# Patient Record
Sex: Female | Born: 1975 | Race: White | Hispanic: No | Smoking: Never smoker
Health system: Southern US, Community
[De-identification: ages and names within clinical notes are randomized; demographics above are authoritative.]

## PROBLEM LIST (undated history)

## (undated) DIAGNOSIS — F32A Depression, unspecified: Secondary | ICD-10-CM

## (undated) DIAGNOSIS — J189 Pneumonia, unspecified organism: Secondary | ICD-10-CM

## (undated) DIAGNOSIS — F909 Attention-deficit hyperactivity disorder, unspecified type: Secondary | ICD-10-CM

## (undated) DIAGNOSIS — F1110 Opioid abuse, uncomplicated: Secondary | ICD-10-CM

## (undated) DIAGNOSIS — F419 Anxiety disorder, unspecified: Secondary | ICD-10-CM

## (undated) DIAGNOSIS — K921 Melena: Secondary | ICD-10-CM

## (undated) DIAGNOSIS — K8010 Calculus of gallbladder with chronic cholecystitis without obstruction: Secondary | ICD-10-CM

## (undated) DIAGNOSIS — I10 Essential (primary) hypertension: Secondary | ICD-10-CM

## (undated) DIAGNOSIS — F329 Major depressive disorder, single episode, unspecified: Secondary | ICD-10-CM

## (undated) DIAGNOSIS — R51 Headache: Secondary | ICD-10-CM

## (undated) DIAGNOSIS — Z22322 Carrier or suspected carrier of Methicillin resistant Staphylococcus aureus: Secondary | ICD-10-CM

## (undated) HISTORY — DX: Carrier or suspected carrier of methicillin resistant Staphylococcus aureus: Z22.322

## (undated) HISTORY — DX: Major depressive disorder, single episode, unspecified: F32.9

## (undated) HISTORY — DX: Depression, unspecified: F32.A

## (undated) HISTORY — DX: Melena: K92.1

## (undated) HISTORY — DX: Headache: R51

## (undated) HISTORY — PX: TYMPANOPLASTY: SHX33

## (undated) HISTORY — DX: Essential (primary) hypertension: I10

## (undated) HISTORY — DX: Anxiety disorder, unspecified: F41.9

## (undated) HISTORY — PX: TYMPANOSTOMY TUBE PLACEMENT: SHX32

## (undated) HISTORY — DX: Attention-deficit hyperactivity disorder, unspecified type: F90.9

## (undated) HISTORY — DX: Opioid abuse, uncomplicated: F11.10

## (undated) HISTORY — PX: COLONOSCOPY: SHX174

## (undated) HISTORY — PX: TUBAL LIGATION: SHX77

---

## 1997-09-13 ENCOUNTER — Inpatient Hospital Stay (HOSPITAL_COMMUNITY): Admission: AD | Admit: 1997-09-13 | Discharge: 1997-09-13 | Payer: Self-pay | Admitting: Obstetrics and Gynecology

## 1999-10-23 ENCOUNTER — Other Ambulatory Visit: Admission: RE | Admit: 1999-10-23 | Discharge: 1999-10-23 | Payer: Self-pay | Admitting: Obstetrics and Gynecology

## 1999-12-20 ENCOUNTER — Encounter: Payer: Self-pay | Admitting: Obstetrics and Gynecology

## 1999-12-20 ENCOUNTER — Ambulatory Visit (HOSPITAL_COMMUNITY): Admission: RE | Admit: 1999-12-20 | Discharge: 1999-12-20 | Payer: Self-pay | Admitting: Obstetrics and Gynecology

## 2000-01-17 ENCOUNTER — Ambulatory Visit (HOSPITAL_COMMUNITY): Admission: RE | Admit: 2000-01-17 | Discharge: 2000-01-17 | Payer: Self-pay | Admitting: Obstetrics and Gynecology

## 2000-01-17 ENCOUNTER — Encounter: Payer: Self-pay | Admitting: Obstetrics and Gynecology

## 2000-02-21 ENCOUNTER — Ambulatory Visit (HOSPITAL_COMMUNITY): Admission: RE | Admit: 2000-02-21 | Discharge: 2000-02-21 | Payer: Self-pay | Admitting: Obstetrics and Gynecology

## 2000-04-05 ENCOUNTER — Inpatient Hospital Stay (HOSPITAL_COMMUNITY): Admission: AD | Admit: 2000-04-05 | Discharge: 2000-04-05 | Payer: Self-pay | Admitting: Obstetrics and Gynecology

## 2000-04-21 ENCOUNTER — Ambulatory Visit (HOSPITAL_COMMUNITY): Admission: RE | Admit: 2000-04-21 | Discharge: 2000-04-21 | Payer: Self-pay | Admitting: Obstetrics and Gynecology

## 2000-04-21 ENCOUNTER — Encounter: Payer: Self-pay | Admitting: Obstetrics and Gynecology

## 2000-05-04 ENCOUNTER — Inpatient Hospital Stay (HOSPITAL_COMMUNITY): Admission: AD | Admit: 2000-05-04 | Discharge: 2000-05-04 | Payer: Self-pay | Admitting: Obstetrics and Gynecology

## 2000-05-09 ENCOUNTER — Inpatient Hospital Stay (HOSPITAL_COMMUNITY): Admission: AD | Admit: 2000-05-09 | Discharge: 2000-05-11 | Payer: Self-pay | Admitting: Obstetrics and Gynecology

## 2000-11-05 ENCOUNTER — Other Ambulatory Visit: Admission: RE | Admit: 2000-11-05 | Discharge: 2000-11-05 | Payer: Self-pay | Admitting: Obstetrics and Gynecology

## 2001-11-12 ENCOUNTER — Other Ambulatory Visit: Admission: RE | Admit: 2001-11-12 | Discharge: 2001-11-12 | Payer: Self-pay | Admitting: Obstetrics and Gynecology

## 2002-04-02 ENCOUNTER — Encounter: Admission: RE | Admit: 2002-04-02 | Discharge: 2002-05-06 | Payer: Self-pay | Admitting: Neurology

## 2002-08-30 ENCOUNTER — Ambulatory Visit (HOSPITAL_BASED_OUTPATIENT_CLINIC_OR_DEPARTMENT_OTHER): Admission: RE | Admit: 2002-08-30 | Discharge: 2002-08-30 | Payer: Self-pay | Admitting: Neurology

## 2002-12-23 ENCOUNTER — Ambulatory Visit (HOSPITAL_COMMUNITY): Admission: RE | Admit: 2002-12-23 | Discharge: 2002-12-23 | Payer: Self-pay | Admitting: Obstetrics and Gynecology

## 2002-12-23 ENCOUNTER — Other Ambulatory Visit: Admission: RE | Admit: 2002-12-23 | Discharge: 2002-12-23 | Payer: Self-pay | Admitting: Obstetrics and Gynecology

## 2002-12-23 ENCOUNTER — Encounter: Payer: Self-pay | Admitting: Obstetrics and Gynecology

## 2002-12-31 ENCOUNTER — Emergency Department (HOSPITAL_COMMUNITY): Admission: EM | Admit: 2002-12-31 | Discharge: 2002-12-31 | Payer: Self-pay | Admitting: Emergency Medicine

## 2002-12-31 ENCOUNTER — Encounter: Payer: Self-pay | Admitting: Emergency Medicine

## 2003-03-13 ENCOUNTER — Emergency Department (HOSPITAL_COMMUNITY): Admission: EM | Admit: 2003-03-13 | Discharge: 2003-03-13 | Payer: Self-pay | Admitting: Emergency Medicine

## 2003-06-28 ENCOUNTER — Ambulatory Visit (HOSPITAL_COMMUNITY): Admission: RE | Admit: 2003-06-28 | Discharge: 2003-06-28 | Payer: Self-pay | Admitting: Obstetrics and Gynecology

## 2003-07-22 ENCOUNTER — Ambulatory Visit (HOSPITAL_BASED_OUTPATIENT_CLINIC_OR_DEPARTMENT_OTHER): Admission: RE | Admit: 2003-07-22 | Discharge: 2003-07-22 | Payer: Self-pay | Admitting: Otolaryngology

## 2003-07-23 ENCOUNTER — Emergency Department (HOSPITAL_COMMUNITY): Admission: AD | Admit: 2003-07-23 | Discharge: 2003-07-23 | Payer: Self-pay | Admitting: Family Medicine

## 2003-10-19 ENCOUNTER — Emergency Department (HOSPITAL_COMMUNITY): Admission: AD | Admit: 2003-10-19 | Discharge: 2003-10-19 | Payer: Self-pay | Admitting: Family Medicine

## 2004-02-21 ENCOUNTER — Other Ambulatory Visit: Admission: RE | Admit: 2004-02-21 | Discharge: 2004-02-21 | Payer: Self-pay | Admitting: Obstetrics and Gynecology

## 2004-06-28 ENCOUNTER — Ambulatory Visit: Payer: Self-pay | Admitting: Family Medicine

## 2004-07-25 ENCOUNTER — Ambulatory Visit: Payer: Self-pay | Admitting: Family Medicine

## 2004-09-08 ENCOUNTER — Ambulatory Visit: Payer: Self-pay | Admitting: Internal Medicine

## 2004-10-14 ENCOUNTER — Emergency Department (HOSPITAL_COMMUNITY): Admission: EM | Admit: 2004-10-14 | Discharge: 2004-10-14 | Payer: Self-pay | Admitting: Family Medicine

## 2004-12-26 ENCOUNTER — Emergency Department (HOSPITAL_COMMUNITY): Admission: EM | Admit: 2004-12-26 | Discharge: 2004-12-26 | Payer: Self-pay | Admitting: Emergency Medicine

## 2004-12-28 ENCOUNTER — Ambulatory Visit: Payer: Self-pay | Admitting: Family Medicine

## 2005-01-18 ENCOUNTER — Ambulatory Visit: Payer: Self-pay | Admitting: Family Medicine

## 2005-03-13 ENCOUNTER — Ambulatory Visit: Payer: Self-pay | Admitting: Family Medicine

## 2005-03-25 ENCOUNTER — Other Ambulatory Visit: Admission: RE | Admit: 2005-03-25 | Discharge: 2005-03-25 | Payer: Self-pay | Admitting: Obstetrics and Gynecology

## 2005-04-01 ENCOUNTER — Ambulatory Visit: Payer: Self-pay | Admitting: Gastroenterology

## 2005-04-10 ENCOUNTER — Ambulatory Visit: Payer: Self-pay | Admitting: Gastroenterology

## 2005-04-10 ENCOUNTER — Ambulatory Visit (HOSPITAL_COMMUNITY): Admission: RE | Admit: 2005-04-10 | Discharge: 2005-04-10 | Payer: Self-pay | Admitting: Gastroenterology

## 2005-06-27 ENCOUNTER — Ambulatory Visit: Payer: Self-pay | Admitting: Family Medicine

## 2005-07-30 ENCOUNTER — Ambulatory Visit: Payer: Self-pay | Admitting: Family Medicine

## 2005-08-20 ENCOUNTER — Ambulatory Visit: Payer: Self-pay | Admitting: Family Medicine

## 2006-06-04 ENCOUNTER — Other Ambulatory Visit: Payer: Self-pay

## 2006-06-04 ENCOUNTER — Emergency Department: Payer: Self-pay | Admitting: Emergency Medicine

## 2006-06-06 ENCOUNTER — Ambulatory Visit: Payer: Self-pay | Admitting: Family Medicine

## 2007-03-18 ENCOUNTER — Telehealth: Payer: Self-pay | Admitting: Family Medicine

## 2007-03-27 DIAGNOSIS — R51 Headache: Secondary | ICD-10-CM

## 2007-03-27 DIAGNOSIS — R519 Headache, unspecified: Secondary | ICD-10-CM | POA: Insufficient documentation

## 2007-04-17 ENCOUNTER — Telehealth: Payer: Self-pay | Admitting: Family Medicine

## 2007-05-12 ENCOUNTER — Ambulatory Visit: Payer: Self-pay | Admitting: Family Medicine

## 2007-05-12 DIAGNOSIS — B9789 Other viral agents as the cause of diseases classified elsewhere: Secondary | ICD-10-CM | POA: Insufficient documentation

## 2007-05-13 ENCOUNTER — Telehealth: Payer: Self-pay | Admitting: Family Medicine

## 2007-05-14 ENCOUNTER — Telehealth: Payer: Self-pay | Admitting: Family Medicine

## 2007-07-15 ENCOUNTER — Telehealth: Payer: Self-pay | Admitting: Family Medicine

## 2007-07-21 ENCOUNTER — Ambulatory Visit (HOSPITAL_COMMUNITY): Admission: RE | Admit: 2007-07-21 | Discharge: 2007-07-21 | Payer: Self-pay | Admitting: Obstetrics and Gynecology

## 2007-08-17 ENCOUNTER — Telehealth: Payer: Self-pay | Admitting: Family Medicine

## 2007-09-21 ENCOUNTER — Telehealth: Payer: Self-pay | Admitting: Family Medicine

## 2007-09-23 ENCOUNTER — Ambulatory Visit: Payer: Self-pay | Admitting: Family Medicine

## 2007-09-24 ENCOUNTER — Encounter: Payer: Self-pay | Admitting: Family Medicine

## 2007-11-02 ENCOUNTER — Telehealth: Payer: Self-pay | Admitting: Family Medicine

## 2007-12-18 ENCOUNTER — Telehealth: Payer: Self-pay | Admitting: Family Medicine

## 2008-01-20 ENCOUNTER — Telehealth: Payer: Self-pay | Admitting: Family Medicine

## 2008-03-16 ENCOUNTER — Telehealth: Payer: Self-pay | Admitting: Family Medicine

## 2008-03-18 ENCOUNTER — Telehealth: Payer: Self-pay | Admitting: Family Medicine

## 2008-03-22 ENCOUNTER — Ambulatory Visit: Payer: Self-pay | Admitting: Family Medicine

## 2008-03-22 DIAGNOSIS — F909 Attention-deficit hyperactivity disorder, unspecified type: Secondary | ICD-10-CM | POA: Insufficient documentation

## 2008-03-22 DIAGNOSIS — I10 Essential (primary) hypertension: Secondary | ICD-10-CM | POA: Insufficient documentation

## 2008-04-14 ENCOUNTER — Emergency Department (HOSPITAL_COMMUNITY): Admission: EM | Admit: 2008-04-14 | Discharge: 2008-04-14 | Payer: Self-pay | Admitting: Family Medicine

## 2008-04-15 ENCOUNTER — Telehealth: Payer: Self-pay | Admitting: Family Medicine

## 2008-05-17 ENCOUNTER — Telehealth: Payer: Self-pay | Admitting: Family Medicine

## 2008-06-17 ENCOUNTER — Telehealth: Payer: Self-pay | Admitting: Family Medicine

## 2008-07-20 ENCOUNTER — Telehealth: Payer: Self-pay | Admitting: Family Medicine

## 2008-07-30 ENCOUNTER — Emergency Department (HOSPITAL_COMMUNITY): Admission: EM | Admit: 2008-07-30 | Discharge: 2008-07-30 | Payer: Self-pay | Admitting: Family Medicine

## 2008-08-11 ENCOUNTER — Telehealth: Payer: Self-pay | Admitting: Family Medicine

## 2008-08-18 ENCOUNTER — Telehealth: Payer: Self-pay | Admitting: Family Medicine

## 2008-08-31 ENCOUNTER — Telehealth: Payer: Self-pay | Admitting: Family Medicine

## 2008-08-31 ENCOUNTER — Ambulatory Visit: Payer: Self-pay | Admitting: Family Medicine

## 2008-08-31 DIAGNOSIS — F1910 Other psychoactive substance abuse, uncomplicated: Secondary | ICD-10-CM

## 2008-08-31 DIAGNOSIS — N3000 Acute cystitis without hematuria: Secondary | ICD-10-CM

## 2008-08-31 DIAGNOSIS — F329 Major depressive disorder, single episode, unspecified: Secondary | ICD-10-CM

## 2008-08-31 LAB — CONVERTED CEMR LAB
Bilirubin Urine: NEGATIVE
Glucose, Urine, Semiquant: NEGATIVE
Ketones, urine, test strip: NEGATIVE
Urobilinogen, UA: 0.2
pH: 6.5

## 2008-09-08 ENCOUNTER — Telehealth: Payer: Self-pay | Admitting: Family Medicine

## 2008-09-14 ENCOUNTER — Ambulatory Visit: Payer: Self-pay | Admitting: Family Medicine

## 2008-09-28 ENCOUNTER — Ambulatory Visit: Payer: Self-pay | Admitting: Family Medicine

## 2008-09-28 DIAGNOSIS — F411 Generalized anxiety disorder: Secondary | ICD-10-CM | POA: Insufficient documentation

## 2008-10-17 ENCOUNTER — Ambulatory Visit: Payer: Self-pay | Admitting: Family Medicine

## 2009-02-20 ENCOUNTER — Ambulatory Visit (HOSPITAL_COMMUNITY): Admission: RE | Admit: 2009-02-20 | Discharge: 2009-02-20 | Payer: Self-pay | Admitting: Obstetrics and Gynecology

## 2009-04-25 ENCOUNTER — Ambulatory Visit (HOSPITAL_COMMUNITY): Admission: RE | Admit: 2009-04-25 | Discharge: 2009-04-25 | Payer: Self-pay | Admitting: Obstetrics and Gynecology

## 2009-04-29 ENCOUNTER — Inpatient Hospital Stay (HOSPITAL_COMMUNITY): Admission: AD | Admit: 2009-04-29 | Discharge: 2009-04-29 | Payer: Self-pay | Admitting: Obstetrics and Gynecology

## 2009-06-08 ENCOUNTER — Encounter (INDEPENDENT_AMBULATORY_CARE_PROVIDER_SITE_OTHER): Payer: Self-pay | Admitting: Obstetrics and Gynecology

## 2009-06-08 ENCOUNTER — Inpatient Hospital Stay (HOSPITAL_COMMUNITY): Admission: RE | Admit: 2009-06-08 | Discharge: 2009-06-11 | Payer: Self-pay | Admitting: Obstetrics and Gynecology

## 2009-06-13 ENCOUNTER — Observation Stay (HOSPITAL_COMMUNITY): Admission: AD | Admit: 2009-06-13 | Discharge: 2009-06-14 | Payer: Self-pay | Admitting: Obstetrics and Gynecology

## 2009-08-07 ENCOUNTER — Telehealth: Payer: Self-pay | Admitting: Family Medicine

## 2009-08-24 ENCOUNTER — Telehealth: Payer: Self-pay | Admitting: Family Medicine

## 2009-08-25 ENCOUNTER — Ambulatory Visit: Payer: Self-pay | Admitting: Family Medicine

## 2009-08-25 DIAGNOSIS — H6120 Impacted cerumen, unspecified ear: Secondary | ICD-10-CM

## 2009-08-25 DIAGNOSIS — H9209 Otalgia, unspecified ear: Secondary | ICD-10-CM | POA: Insufficient documentation

## 2009-08-25 DIAGNOSIS — K120 Recurrent oral aphthae: Secondary | ICD-10-CM

## 2009-10-11 ENCOUNTER — Ambulatory Visit (HOSPITAL_BASED_OUTPATIENT_CLINIC_OR_DEPARTMENT_OTHER): Admission: RE | Admit: 2009-10-11 | Discharge: 2009-10-11 | Payer: Self-pay | Admitting: General Surgery

## 2009-12-04 ENCOUNTER — Telehealth: Payer: Self-pay | Admitting: Family Medicine

## 2010-01-01 ENCOUNTER — Telehealth: Payer: Self-pay | Admitting: Family Medicine

## 2010-04-03 ENCOUNTER — Telehealth: Payer: Self-pay | Admitting: Family Medicine

## 2010-08-08 ENCOUNTER — Telehealth: Payer: Self-pay | Admitting: Family Medicine

## 2010-08-12 HISTORY — PX: KNEE ARTHROSCOPY: SUR90

## 2010-08-17 ENCOUNTER — Ambulatory Visit: Admit: 2010-08-17 | Payer: Self-pay | Admitting: Family Medicine

## 2010-09-01 ENCOUNTER — Encounter: Payer: Self-pay | Admitting: Obstetrics and Gynecology

## 2010-09-02 ENCOUNTER — Encounter: Payer: Self-pay | Admitting: Obstetrics and Gynecology

## 2010-09-03 ENCOUNTER — Encounter: Payer: Self-pay | Admitting: Family Medicine

## 2010-09-10 ENCOUNTER — Emergency Department (HOSPITAL_COMMUNITY)
Admission: EM | Admit: 2010-09-10 | Discharge: 2010-09-10 | Payer: Self-pay | Source: Home / Self Care | Admitting: Emergency Medicine

## 2010-09-10 LAB — POCT URINALYSIS DIPSTICK
Bilirubin Urine: NEGATIVE
Hgb urine dipstick: NEGATIVE
Ketones, ur: NEGATIVE mg/dL
Nitrite: NEGATIVE
Protein, ur: NEGATIVE mg/dL
Specific Gravity, Urine: 1.02 (ref 1.005–1.030)
Urine Glucose, Fasting: NEGATIVE mg/dL
Urobilinogen, UA: 1 mg/dL (ref 0.0–1.0)
pH: 8.5 — ABNORMAL HIGH (ref 5.0–8.0)

## 2010-09-11 NOTE — Progress Notes (Signed)
Summary: REFILL REQUEST  Phone Note Refill Request Message from:  Patient on April 03, 2010 1:09 PM  Refills Requested: Medication #1:  ADDERALL 30 MG TABS two times a day   Notes: Pt can be reached at [phone] when Rx is ready for p/u.    Initial call taken by: Debbra Riding,  April 03, 2010 1:09 PM  Follow-up for Phone Call        done Follow-up by: Nelwyn Salisbury MD,  April 04, 2010 1:32 PM    New/Updated Medications: ADDERALL 30 MG TABS (AMPHETAMINE-DEXTROAMPHETAMINE) two times a day ADDERALL 30 MG TABS (AMPHETAMINE-DEXTROAMPHETAMINE) two times a day, may fill on 05-05-10 ADDERALL 30 MG TABS (AMPHETAMINE-DEXTROAMPHETAMINE) two times a day, may fill on 06-04-10 Prescriptions: ADDERALL 30 MG TABS (AMPHETAMINE-DEXTROAMPHETAMINE) two times a day, may fill on 06-04-10  #60 x 0   Entered and Authorized by:   Nelwyn Salisbury MD   Signed by:   Nelwyn Salisbury MD on 04/04/2010   Method used:   Print then Give to Patient   RxID:   [id number] ADDERALL 30 MG TABS (AMPHETAMINE-DEXTROAMPHETAMINE) two times a day, may fill on 05-05-10  #60 x 0   Entered and Authorized by:   Nelwyn Salisbury MD   Signed by:   Nelwyn Salisbury MD on 04/04/2010   Method used:   Print then Give to Patient   RxID:   [id number] ADDERALL 30 MG TABS (AMPHETAMINE-DEXTROAMPHETAMINE) two times a day  #60 x 0   Entered and Authorized by:   Nelwyn Salisbury MD   Signed by:   Nelwyn Salisbury MD on 04/04/2010   Method used:   Print then Give to Patient   RxID:   [id number]

## 2010-09-11 NOTE — Assessment & Plan Note (Signed)
Summary: MOUTH ULCERS / MED CK (REFILL) //RS   Vital Signs:  Patient profile:   35 year old female Weight:      251.5 pounds Temp:     97.8 degrees F oral Pulse rate:   131 / minute BP sitting:   114 / 84  (left arm) Cuff size:   large  Vitals Entered By: Alfred Levins, CMA (August 25, 2009 2:33 PM) CC: renew meds, blisters in mouth   History of Present Illness: Here for several reasons. This is the first time I have seen her since the delivery of her second child 3 months ago. Her pregnancy went well,and she had a Csection followed by a BTL per Dr. Senaida Ores. During her pregancy her migraines went away and her BP normalized. In fact she was off all BP meds during the pregnancy. After delivery her BP went back up, and Dr. Senaida Ores put her back on Hyzaar like she was before. She has been back on this for 3 weeks, and her BP is normal.  She has returned to work full time, and thus she needed to get back on Adderall. Also 3 days ago she developed several painful ulcerations in her mouth, and she would like me to look at them. No URI symptoms. She also developed an uncomfortable pressure sensation in the right ear about 3 days ago.   Current Medications (verified): 1)  Adderall 30 Mg Tabs (Amphetamine-Dextroamphetamine) .... Two Times A Day  Allergies (verified): No Known Drug Allergies  Past History:  Past Medical History: Blood in Stool Headache/Migraines Hypertension ADHD narcotic abuse Anxiety Depression sees Dr. Senaida Ores for GYN exams MRSA infection at C  section site 05-2009, resolved   Past Surgical History: Tubal ligation 2008  Caesarean section 05-2009 and repeat tubal ligation T tube in right ear 2009 per Dr. Annalee Genta  Review of Systems  The patient denies anorexia, fever, weight loss, weight gain, vision loss, decreased hearing, hoarseness, chest pain, syncope, dyspnea on exertion, peripheral edema, prolonged cough, headaches, hemoptysis, abdominal pain,  melena, hematochezia, severe indigestion/heartburn, hematuria, incontinence, genital sores, muscle weakness, suspicious skin lesions, transient blindness, difficulty walking, depression, unusual weight change, abnormal bleeding, enlarged lymph nodes, angioedema, breast masses, and testicular masses.    Physical Exam  General:  Well-developed,well-nourished,in no acute distress; alert,appropriate and cooperative throughout examination Head:  Normocephalic and atraumatic without obvious abnormalities. No apparent alopecia or balding. Eyes:  No corneal or conjunctival inflammation noted. EOMI. Perrla. Funduscopic exam benign, without hemorrhages, exudates or papilledema. Vision grossly normal. Ears:  left ear is clear, the right ear has some cerumen in the canal, and a T tube is lying in the canal behind the cerumen. The TM is inflamed but not infected Nose:  External nasal examination shows no deformity or inflammation. Nasal mucosa are pink and moist without lesions or exudates. Mouth:  5 or 6 aphthous ulcers on the right cheek and tongue Neck:  No deformities, masses, or tenderness noted. Lungs:  Normal respiratory effort, chest expands symmetrically. Lungs are clear to auscultation, no crackles or wheezes. Heart:  Normal rate and regular rhythm. S1 and S2 normal without gallop, murmur, click, rub or other extra sounds. Neurologic:  alert & oriented X3, cranial nerves II-XII intact, strength normal in all extremities, and gait normal.   Psych:  Cognition and judgment appear intact. Alert and cooperative with normal attention span and concentration. No apparent delusions, illusions, hallucinations   Impression & Recommendations:  Problem # 1:  APHTHOUS ULCERS (ICD-528.2)  Problem #  2:  CERUMEN IMPACTION (ICD-380.4)  Problem # 3:  EAR PAIN (ICD-388.70)  Problem # 4:  ADHD (ICD-314.01)  Problem # 5:  HYPERTENSION (ICD-401.9)  Her updated medication list for this problem includes:    Hyzaar  50-12.5 Mg Tabs (Losartan potassium-hctz) ..... Once daily  Problem # 6:  HEADACHE (ICD-784.0)  The following medications were removed from the medication list:    Vicodin 5-500 Mg Tabs (Hydrocodone-acetaminophen) .Marland Kitchen..Marland Kitchen Two times a day  Complete Medication List: 1)  Adderall 30 Mg Tabs (Amphetamine-dextroamphetamine) .... Two times a day, may fill on 10-23-09 2)  Hyzaar 50-12.5 Mg Tabs (Losartan potassium-hctz) .... Once daily  Patient Instructions: 1)  We refilled Adderall for 3 months. Her HTN is stable, so we refilled her Hyzaar. Her right ear pain is probably from the T tube coming out a few days ago. We could not irrigate the cerumen out today due to her not tolerating this well. We decided to leave it alone for a few days to allow the TM inflammation to subside. After that she will try an OTC ear was removal product. if that doesn't work , I suggested she see Dr. Annalee Genta again.  Prescriptions: HYZAAR 50-12.5 MG TABS (LOSARTAN POTASSIUM-HCTZ) once daily  #30 x 11   Entered and Authorized by:   Nelwyn Salisbury MD   Signed by:   Nelwyn Salisbury MD on 08/25/2009   Method used:   [facility] [phone][facility] (retail)       [address]       [place], [place]  [address]       Ph: [phone] or [phone]       Fax: [phone]   RxID:   [id number] ADDERALL 30 MG TABS (AMPHETAMINE-DEXTROAMPHETAMINE) two times a day, may fill on 10-23-09  #60 x 0   Entered and Authorized by:   Nelwyn Salisbury MD   Signed by:   Nelwyn Salisbury MD on 08/25/2009   Method used:   Print then Give to Patient   RxID:   [id number] ADDERALL 30 MG TABS (AMPHETAMINE-DEXTROAMPHETAMINE) two times a day, may fill on 09-25-09  #60 x 0   Entered and Authorized by:   Nelwyn Salisbury MD   Signed by:   Nelwyn Salisbury MD on 08/25/2009   Method used:   Print then Give to Patient   RxID:   [id number] ADDERALL 30 MG TABS (AMPHETAMINE-DEXTROAMPHETAMINE) two times a day  #60 x 0   Entered and  Authorized by:   Nelwyn Salisbury MD   Signed by:   Nelwyn Salisbury MD on 08/25/2009   Method used:   Print then Give to Patient   RxID:   [id number]

## 2010-09-11 NOTE — Progress Notes (Signed)
Summary: NEW RX  Phone Note Call from Patient Call back at Home Phone [phone]   Caller: Patient Call For: Nelwyn Salisbury MD Summary of Call: PT NEEDS NEW RX ADDERALL 30 MG Initial call taken by: Heron Sabins,  Jan 01, 2010 9:54 AM  Follow-up for Phone Call        done Follow-up by: Nelwyn Salisbury MD,  Jan 01, 2010 10:51 AM  Additional Follow-up for Phone Call Additional follow up Details #1::        Phone Call Completed Additional Follow-up by: Raechel Ache, RN,  Jan 01, 2010 10:57 AM    New/Updated Medications: ADDERALL 30 MG TABS (AMPHETAMINE-DEXTROAMPHETAMINE) two times a day ADDERALL 30 MG TABS (AMPHETAMINE-DEXTROAMPHETAMINE) two times a day, may fill on 02-01-10 ADDERALL 30 MG TABS (AMPHETAMINE-DEXTROAMPHETAMINE) two times a day, may fill on 03-03-10 Prescriptions: ADDERALL 30 MG TABS (AMPHETAMINE-DEXTROAMPHETAMINE) two times a day, may fill on 03-03-10  #60 x 0   Entered and Authorized by:   Nelwyn Salisbury MD   Signed by:   Nelwyn Salisbury MD on 01/01/2010   Method used:   Print then Give to Patient   RxID:   [id number] ADDERALL 30 MG TABS (AMPHETAMINE-DEXTROAMPHETAMINE) two times a day, may fill on 02-01-10  #60 x 0   Entered and Authorized by:   Nelwyn Salisbury MD   Signed by:   Nelwyn Salisbury MD on 01/01/2010   Method used:   Print then Give to Patient   RxID:   [id number] ADDERALL 30 MG TABS (AMPHETAMINE-DEXTROAMPHETAMINE) two times a day  #60 x 0   Entered and Authorized by:   Nelwyn Salisbury MD   Signed by:   Nelwyn Salisbury MD on 01/01/2010   Method used:   Print then Give to Patient   RxID:   [id number]

## 2010-09-11 NOTE — Progress Notes (Signed)
Summary: sda slot  Phone Note Call from Patient Call back at Home Phone [phone]   Caller: Patient Call For: Nelwyn Salisbury MD Reason for Call: Acute Illness Summary of Call: pt has blister in mouth also needs fup on meds.Can I sch for friday in sda slot afternoon? Initial call taken by: Heron Sabins,  August 24, 2009 10:16 AM  Follow-up for Phone Call        okay Follow-up by: Nelwyn Salisbury MD,  August 24, 2009 11:43 AM  Additional Follow-up for Phone Call Additional follow up Details #1::        lmom Additional Follow-up by: Heron Sabins,  August 24, 2009 11:58 AM    Additional Follow-up for Phone Call Additional follow up Details #2::    ov sch Follow-up by: Heron Sabins,  August 25, 2009 8:31 AM

## 2010-09-11 NOTE — Progress Notes (Signed)
Summary: REQ FOR REFILL RX   (HYZAAR)  Phone Note Refill Request   Refills Requested: Medication #1:  HYZAAR 50-12.5 MG TABS once daily.   Notes: Pt [facility] Initial call taken by: Debbra Riding,  December 04, 2009 11:03 AM Caller: Patient     [phone]    Prescriptions: HYZAAR 50-12.5 MG TABS (LOSARTAN POTASSIUM-HCTZ) once daily  #30 x 11   Entered by:   Raechel Ache, RN   Authorized by:   Nelwyn Salisbury MD   Signed by:   Raechel Ache, RN on 12/04/2009   Method used:   [facility]ronically to        [facility] [phone][facility] (retail)       [address]       [place], [place]  [address]       Ph: [phone] or [phone]       Fax: [phone]   RxID:   [id number]

## 2010-09-11 NOTE — Progress Notes (Signed)
Summary: refill  Phone Note Call from Patient Call back at [phone]   Caller: pt live Call For: Clent Ridges  Summary of Call: adderall 30mg  She got the XR the last time and it is too expensive.  She needs to go back to the regular Adderall 30mg . take 2 per day  Initial call taken by: Roselle Locus,  June 17, 2008 11:33 AM    New/Updated Medications: ADDERALL 30 MG TABS (AMPHETAMINE-DEXTROAMPHETAMINE) two times a day   Prescriptions: ADDERALL 30 MG TABS (AMPHETAMINE-DEXTROAMPHETAMINE) two times a day  #60 x 0   Entered by:   Nelwyn Salisbury MD   Authorized by:   Alfred Levins, CMA   Signed by:   Nelwyn Salisbury MD on 06/17/2008   Method used:   Print then Give to Patient   RxID:   [id number]

## 2010-09-13 NOTE — Progress Notes (Signed)
Summary: refill  Phone Note Refill Request Call back at Home Phone [phone] Message from:  Patient---live call  Refills Requested: Medication #1:  ADDERALL 30 MG TABS two times a day call pt when ready  Initial call taken by: Warnell Forester,  August 08, 2010 4:37 PM  Follow-up for Phone Call        I gave her a one month refill only. She needs to see me in January Follow-up by: Nelwyn Salisbury MD,  August 08, 2010 6:01 PM  Additional Follow-up for Phone Call Additional follow up Details #1::        pt aware Additional Follow-up by: Pura Spice, RN,  August 09, 2010 9:27 AM    New/Updated Medications: ADDERALL 30 MG TABS (AMPHETAMINE-DEXTROAMPHETAMINE) two times a day Prescriptions: ADDERALL 30 MG TABS (AMPHETAMINE-DEXTROAMPHETAMINE) two times a day  #60 x 0   Entered and Authorized by:   Nelwyn Salisbury MD   Signed by:   Nelwyn Salisbury MD on 08/08/2010   Method used:   Print then Give to Patient   RxID:   [id number]

## 2010-09-19 NOTE — Medication Information (Signed)
Summary: PA and Aprpoval for Losartan HCTZ  PA and Aprpoval for Losartan HCTZ   Imported By: Maryln Gottron 09/12/2010 12:11:51  _____________________________________________________________________  External Attachment:    Type:   Image     Comment:   External Document

## 2010-11-02 LAB — POCT I-STAT, CHEM 8
BUN: 6 mg/dL (ref 6–23)
Chloride: 101 mEq/L (ref 96–112)
Creatinine, Ser: 0.7 mg/dL (ref 0.4–1.2)
Potassium: 3.3 mEq/L — ABNORMAL LOW (ref 3.5–5.1)
Sodium: 138 mEq/L (ref 135–145)
TCO2: 27 mmol/L (ref 0–100)

## 2010-11-02 LAB — WOUND CULTURE

## 2010-11-02 LAB — POCT PREGNANCY, URINE: Preg Test, Ur: NEGATIVE

## 2010-11-14 LAB — COMPREHENSIVE METABOLIC PANEL
ALT: 25 U/L (ref 0–35)
AST: 26 U/L (ref 0–37)
Albumin: 2.5 g/dL — ABNORMAL LOW (ref 3.5–5.2)
CO2: 25 mEq/L (ref 19–32)
Calcium: 8.5 mg/dL (ref 8.4–10.5)
Chloride: 107 mEq/L (ref 96–112)
GFR calc Af Amer: >60 mL/min (ref >60)
GFR calc non Af Amer: >60 mL/min (ref >60)
Sodium: 139 mEq/L (ref 135–145)
Total Bilirubin: 0.7 mg/dL (ref 0.3–1.2)

## 2010-11-14 LAB — URIC ACID: Uric Acid, Serum: 4.9 mg/dL (ref 2.4–7.0)

## 2010-11-14 LAB — DIFFERENTIAL
Eosinophils Absolute: 0 10*3/uL (ref 0.0–0.7)
Eosinophils Relative: 1 % (ref 0–5)
Lymphs Abs: 0.6 10*3/uL — ABNORMAL LOW (ref 0.7–4.0)
Monocytes Absolute: 0.6 10*3/uL (ref 0.1–1.0)

## 2010-11-14 LAB — URINE CULTURE
Colony Count: >=100000
Special Requests: NEGATIVE

## 2010-11-14 LAB — CBC
Platelets: 220 10*3/uL (ref 150–400)
RBC: 2.4 MIL/uL — ABNORMAL LOW (ref 3.87–5.11)
WBC: 7.9 10*3/uL (ref 4.0–10.5)

## 2010-11-14 LAB — URINALYSIS, MICROSCOPIC ONLY
Bilirubin Urine: NEGATIVE
Ketones, ur: NEGATIVE mg/dL
Nitrite: NEGATIVE
pH: 6.5 (ref 5.0–8.0)

## 2010-11-15 LAB — RH IMMUNE GLOB WKUP(>/=20WKS)(NOT WOMEN'S HOSP): Fetal Screen: NEGATIVE

## 2010-11-15 LAB — CBC
HCT: 25.2 % — ABNORMAL LOW (ref 36.0–46.0)
HCT: 34.1 % — ABNORMAL LOW (ref 36.0–46.0)
Hemoglobin: 8.6 g/dL — ABNORMAL LOW (ref 12.0–15.0)
MCHC: 34.5 g/dL (ref 30.0–36.0)
MCV: 97.7 fL (ref 78.0–100.0)
Platelets: 160 10*3/uL (ref 150–400)
Platelets: 174 10*3/uL (ref 150–400)
RDW: 15.6 % — ABNORMAL HIGH (ref 11.5–15.5)
RDW: 16 % — ABNORMAL HIGH (ref 11.5–15.5)
WBC: 16.8 10*3/uL — ABNORMAL HIGH (ref 4.0–10.5)
WBC: 9.8 10*3/uL (ref 4.0–10.5)

## 2010-11-15 LAB — LACTATE DEHYDROGENASE: LDH: 162 U/L (ref 94–250)

## 2010-11-15 LAB — RPR: RPR Ser Ql: NONREACTIVE

## 2010-11-15 LAB — COMPREHENSIVE METABOLIC PANEL
Albumin: 3.3 g/dL — ABNORMAL LOW (ref 3.5–5.2)
Alkaline Phosphatase: 90 U/L (ref 39–117)
BUN: 4 mg/dL — ABNORMAL LOW (ref 6–23)
Creatinine, Ser: 0.51 mg/dL (ref 0.4–1.2)
Glucose, Bld: 102 mg/dL — ABNORMAL HIGH (ref 70–99)
Total Protein: 5.9 g/dL — ABNORMAL LOW (ref 6.0–8.3)

## 2010-11-15 LAB — CCBB MATERNAL DONOR DRAW

## 2010-11-15 LAB — URIC ACID: Uric Acid, Serum: 4.8 mg/dL (ref 2.4–7.0)

## 2010-11-16 LAB — WET PREP, GENITAL: Clue Cells Wet Prep HPF POC: NONE SEEN

## 2010-11-19 ENCOUNTER — Telehealth: Payer: Self-pay | Admitting: Family Medicine

## 2010-11-19 MED ORDER — AMPHETAMINE-DEXTROAMPHETAMINE 30 MG PO TABS: 30.0000 mg | ORAL_TABLET | Freq: Two times a day (BID) | ORAL | Status: DC

## 2010-11-19 NOTE — Telephone Encounter (Signed)
Pt called and needs script for Adderall 30 mg bid.

## 2010-11-19 NOTE — Telephone Encounter (Signed)
done

## 2010-11-20 NOTE — Telephone Encounter (Signed)
Pt notified rx ready for pick up.

## 2010-11-21 ENCOUNTER — Encounter: Payer: Self-pay | Admitting: Family Medicine

## 2010-11-27 ENCOUNTER — Ambulatory Visit: Payer: Self-pay | Admitting: Family Medicine

## 2010-12-28 NOTE — Discharge Summary (Signed)
[facility]  Patient:    Brittney Manning, Brittney Manning                     MRN: [id number] Adm. Date:  [id number] Disc. Date: 05/11/00 Attending:  Oliver Pila                           Discharge Summary  DISCHARGE DIAGNOSES:          1. Term pregnancy at 39 weeks delivered.                               2. History of macrosomia.                               3. Status post normal spontaneous vaginal delivery.  DISCHARGE MEDICATIONS:        1. Motrin 600 mg p.o. every 6 hours.                               2. Percocet one to two tablets p.o. every 4 hours.  DISCHARGE FOLLOW-UP:          The patient is to follow up in approximately 6 weeks for her routine postpartum exam.  HISTORY OF PRESENT ILLNESS:   The patient is a 35 year old G2, P1-0-0-1 who was admitted at 39 weeks for induction given a favorable cervix and a history of macrosomia in her last pregnancy. Current pregnancy showed an EFW of greater than 90th percentile on September 10. Pregnancy was otherwise uncomplicated, except for Rh negative status for which she received RhoGAM.  PRENATAL LABORATORY DATA:     A- antibody negative. RPR nonreactive. Rubella immune. Hepatitis B surface antigen negative. HIV negative. GC negative. Chlamydia negative. GPS negative.  PAST OBSTETRICAL HISTORY:     In 1996, she had a normal spontaneous vaginal delivery of a 9 pound 3 ounce infant with pregnancy-induced hypertension.  PAST SURGICAL HISTORY:        A bilateral inguinal hernia repair in 1984.  PAST GYNECOLOGICAL HISTORY:   None.  PAST MEDICAL HISTORY:         None.  HOSPITAL COURSE:              On admission, the patient was 75% effaced, 1+ cm dilated, at a -2 station. Fetal heart rate was reactive with no contractions. The patient was begun on Pitocin and ______ to rupture performed approximately 5 hours after Pitocin was begun with clear fluid obtained. She then progressed quite slowly until  reaching 5 to 6 cm and then reached complete dilation within 2 hours. She pushed well with a normal spontaneous vaginal delivery with a vigorous female infant over a second degree laceration. Apgars were 8 and 9, weight was 7 pounds 15 ounces. Placenta delivered spontaneously, and the second degree laceration was repaired with 2-0 Vicryl. The patient had originally stated a desire for a bilateral tubal ligation. However, after counseling, she declined this procedure; and instead, perhaps, she would like to have an IUD as she may consider having more children in the future. She did very well postpartum; and on postpartum day #2, she was discharged to home, afebrile, with stable vital signs, and no complaints. DD:  05/11/00 TD:  05/11/00 Job: [id number]

## 2010-12-28 NOTE — Op Note (Signed)
Brittney Manning, [name] NO.:  [id number]   MEDICAL RECORD NO.:  [id number]                   PATIENT TYPE:  AMB   LOCATION:  DSC                                  FACILITY:  [facility]   PHYSICIAN:  Hermelinda Medicus, M.D.                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  She apparently went through a considerable amount of  pain and is extremely frightened in the office to consider myringotomy and  tube.  She also is having tinnitus.  She has a 40 decibel air bone gap on  the right side and has failed Neo-Synephrine, Proplex PD, and as she has  blood pressure problems, and now enters for a MAC myringotomy and tube on  the right side.   PREOPERATIVE DIAGNOSIS:  Status post right ruptured tympanic membrane x 4  with also pressure equalization tube, persistent serous fluid, with tinnitus  and 40 decibel air bone gap.   POSTOPERATIVE DIAGNOSIS:  Status post right ruptured tympanic membrane x 4  with also pressure equalization tube, persistent serous fluid, with tinnitus  and 40 decibel air bone gap.   OPERATION:  Right myringotomy tube, Paparella.   SURGEON:  Hermelinda Medicus, M.D.   ANESTHESIA:  MAC with local.   PROCEDURE:  The patient was placed in the supine position.  The right ear  was marked.  The patient was taken to the operating room and Betadine was  used for prepping.  The patient was appropriately draped.  Using the scope,  we injected the four quadrants of the external ear canal.  She was given  medication to relax her.  A myringotomy was carried out.  Thick glue was  suctioned from behind her tympanic  membrane and a type 2 Paparella PE tube  was placed in the anterior inferior aspect of the tympanic membrane.  The  patient tolerated the procedure well.  Pediotic drops were placed postop and  the patient will be followed in one week, three weeks, three months, six  months, and a year.                                               Hermelinda Medicus, M.D.    JC/MEDQ  D:  07/22/2003  T:  07/22/2003  Job:  [id number]   cc:   Jeannett Senior A. Clent Ridges, M.D. [facility]

## 2011-01-28 ENCOUNTER — Encounter: Payer: Self-pay | Admitting: Family Medicine

## 2011-01-28 ENCOUNTER — Ambulatory Visit (INDEPENDENT_AMBULATORY_CARE_PROVIDER_SITE_OTHER): Payer: Managed Care, Other (non HMO) | Admitting: Family Medicine

## 2011-01-28 VITALS — BP 94/68 | Temp 98.4°F | Ht 65.0 in | Wt 255.0 lb

## 2011-01-28 DIAGNOSIS — F909 Attention-deficit hyperactivity disorder, unspecified type: Secondary | ICD-10-CM

## 2011-01-28 DIAGNOSIS — M25569 Pain in unspecified knee: Secondary | ICD-10-CM

## 2011-01-28 MED ORDER — AMPHETAMINE-DEXTROAMPHETAMINE 30 MG PO TABS: 30.0000 mg | ORAL_TABLET | Freq: Two times a day (BID) | ORAL | Status: DC

## 2011-01-28 NOTE — Progress Notes (Signed)
  Subjective:    Patient ID: Brittney Manning, female    DOB: [birth year], 35 y.o.   MRN: [id number]  HPI Here to follow up on ADHD but also for 3 days of pain and swelling in the left knee. It has popped and cracked when she walks for several years but has never been painful before. She started walking to lose weight about one month ago, and she is walking one mile 4-5 days a week. She wears supportive shoes. No recent trauma.    Review of Systems  Constitutional: Negative.   Respiratory: Negative.   Cardiovascular: Negative.   Musculoskeletal: Positive for joint swelling and arthralgias.       Objective:   Physical Exam  Constitutional:       Walks with a slight limp   Cardiovascular: Normal rate, regular rhythm, normal heart sounds and intact distal pulses.   Pulmonary/Chest: Effort normal and breath sounds normal.  Musculoskeletal:       The left knee is slightly tender over the medial joint space. No swelling. Full ROM. Crepitus is present in both knees  Psychiatric: She has a normal mood and affect. Her behavior is normal. Thought content normal.          Assessment & Plan:  She has some early arthritis. Rest, Ibuprofen, and ice prn. Losing weight is imperative, but I advised her to try non-weight bearing exercise like riding a bike or swimming. Refilled her Adderall

## 2011-05-17 LAB — POCT URINALYSIS DIP (DEVICE)
Glucose, UA: NEGATIVE mg/dL
Nitrite: NEGATIVE

## 2011-05-17 LAB — URINE CULTURE: Colony Count: 7000

## 2011-05-31 ENCOUNTER — Telehealth: Payer: Self-pay | Admitting: Family Medicine

## 2011-05-31 NOTE — Telephone Encounter (Signed)
Pt requesting refill on amphetamine-dextroamphetamine (ADDERALL, 30MG ,) 30 MG tablet Pt is also having issues with anxiety and said she was on Lexapro before she was pregnant and is wanting to start taking it again. Please contact pt.

## 2011-06-03 MED ORDER — AMPHETAMINE-DEXTROAMPHETAMINE 30 MG PO TABS: 30.0000 mg | ORAL_TABLET | Freq: Two times a day (BID) | ORAL | Status: DC

## 2011-06-03 NOTE — Telephone Encounter (Signed)
adderall is done. Call in Lexapro 10 mg a day, #30 with 2 rf. If she wants to move back up to 20 mg a day, we can do so after the first month

## 2011-06-03 NOTE — Telephone Encounter (Signed)
Script is ready for pick up.

## 2011-06-03 NOTE — Telephone Encounter (Signed)
I tried to reach pt by phone and no one there by that name.

## 2011-06-04 MED ORDER — ESCITALOPRAM OXALATE 10 MG PO TABS: 10.0000 mg | ORAL_TABLET | Freq: Every day | ORAL | Status: DC

## 2011-06-04 NOTE — Telephone Encounter (Signed)
Script sent e-scribe 

## 2011-06-04 NOTE — Telephone Encounter (Signed)
Pt called to check on status of refill. Pt informed. Pt cell phone dropped call.

## 2011-08-27 ENCOUNTER — Telehealth: Payer: Self-pay | Admitting: Family Medicine

## 2011-08-27 NOTE — Telephone Encounter (Signed)
Pt called req refills of amphetamine-dextroamphetamine (ADDERALL, 30MG ,) 30 MG tablet and escitalopram (LEXAPRO) 10 MG tablet. Pls call when ready for pick up.

## 2011-08-28 MED ORDER — AMPHETAMINE-DEXTROAMPHETAMINE 30 MG PO TABS: 30.0000 mg | ORAL_TABLET | Freq: Two times a day (BID) | ORAL | Status: DC

## 2011-08-28 MED ORDER — ESCITALOPRAM OXALATE 10 MG PO TABS: 10.0000 mg | ORAL_TABLET | Freq: Every day | ORAL | Status: DC

## 2011-08-28 NOTE — Telephone Encounter (Signed)
All these were printed

## 2011-08-28 NOTE — Telephone Encounter (Signed)
Scripts are ready for pick up and left voice message. 

## 2011-12-03 ENCOUNTER — Telehealth: Payer: Self-pay | Admitting: Family Medicine

## 2011-12-03 NOTE — Telephone Encounter (Signed)
Refill request for Escitalopram 10 mg and Hyzaar, pt last here on 01/28/11 and [facility]

## 2011-12-04 MED ORDER — ESCITALOPRAM OXALATE 10 MG PO TABS: 10.0000 mg | ORAL_TABLET | Freq: Every day | ORAL | Status: DC

## 2011-12-04 MED ORDER — LOSARTAN POTASSIUM-HCTZ 50-12.5 MG PO TABS: 1.0000 | ORAL_TABLET | Freq: Every day | ORAL | Status: DC

## 2011-12-04 NOTE — Telephone Encounter (Signed)
I sent both scripts e-scribe. 

## 2011-12-04 NOTE — Telephone Encounter (Signed)
Okay for one year of both  

## 2011-12-11 ENCOUNTER — Telehealth: Payer: Self-pay | Admitting: Family Medicine

## 2011-12-11 MED ORDER — AMPHETAMINE-DEXTROAMPHETAMINE 30 MG PO TABS: 30.0000 mg | ORAL_TABLET | Freq: Two times a day (BID) | ORAL | Status: DC

## 2011-12-11 NOTE — Telephone Encounter (Signed)
Needs refills

## 2012-03-31 ENCOUNTER — Telehealth: Payer: Self-pay | Admitting: Family Medicine

## 2012-03-31 NOTE — Telephone Encounter (Signed)
Patient called stating that she would like a 3 mth refill of adderall. Please assist.

## 2012-04-01 MED ORDER — AMPHETAMINE-DEXTROAMPHETAMINE 30 MG PO TABS: 30.0000 mg | ORAL_TABLET | Freq: Two times a day (BID) | ORAL | Status: DC

## 2012-04-01 NOTE — Telephone Encounter (Signed)
I will write for one month only. She needs an OV

## 2012-04-01 NOTE — Telephone Encounter (Signed)
Script is ready for pick up and I left voice message with below information.

## 2012-04-08 ENCOUNTER — Ambulatory Visit (INDEPENDENT_AMBULATORY_CARE_PROVIDER_SITE_OTHER): Payer: Self-pay | Admitting: Family Medicine

## 2012-04-08 ENCOUNTER — Encounter: Payer: Self-pay | Admitting: Family Medicine

## 2012-04-08 ENCOUNTER — Ambulatory Visit: Payer: Managed Care, Other (non HMO) | Admitting: Family Medicine

## 2012-04-08 VITALS — BP 120/86 | HR 99 | Temp 98.1°F | Wt 263.0 lb

## 2012-04-08 DIAGNOSIS — F909 Attention-deficit hyperactivity disorder, unspecified type: Secondary | ICD-10-CM

## 2012-04-08 DIAGNOSIS — F329 Major depressive disorder, single episode, unspecified: Secondary | ICD-10-CM

## 2012-04-08 DIAGNOSIS — H60399 Other infective otitis externa, unspecified ear: Secondary | ICD-10-CM

## 2012-04-08 DIAGNOSIS — F411 Generalized anxiety disorder: Secondary | ICD-10-CM

## 2012-04-08 DIAGNOSIS — H6090 Unspecified otitis externa, unspecified ear: Secondary | ICD-10-CM

## 2012-04-08 DIAGNOSIS — I10 Essential (primary) hypertension: Secondary | ICD-10-CM

## 2012-04-08 MED ORDER — NEOMYCIN-POLYMYXIN-HC 3.5-10000-1 OT SUSP: 4.0000 [drp] | Freq: Four times a day (QID) | OTIC | Status: AC

## 2012-04-08 MED ORDER — AMPHETAMINE-DEXTROAMPHETAMINE 30 MG PO TABS: 30.0000 mg | ORAL_TABLET | Freq: Two times a day (BID) | ORAL | Status: DC

## 2012-04-08 MED ORDER — ESCITALOPRAM OXALATE 20 MG PO TABS: 20.0000 mg | ORAL_TABLET | Freq: Every day | ORAL | Status: AC

## 2012-04-08 NOTE — Progress Notes (Signed)
  Subjective:    Patient ID: Brittney Manning, female    DOB: 01-24-1976, 36 y.o.   MRN: [id number]  HPI Here to follow up on depression, HTN, and ADHD. She is pleased with Adderall but the Lexapro does not help as much as it used to. She has had some sadness and anxiety lately. Also she has had 2 days of pain in the right ear. She has had two tympanoplasties in the past year and she thinks it may be infected again.    Review of Systems  Constitutional: Negative.   HENT: Positive for ear pain. Negative for hearing loss, congestion, postnasal drip, sinus pressure, tinnitus and ear discharge.   Eyes: Negative.   Respiratory: Negative.   Neurological: Negative.   Psychiatric/Behavioral: Positive for dysphoric mood. Negative for confusion, decreased concentration and agitation. The patient is nervous/anxious.        Objective:   Physical Exam  Constitutional: She is oriented to person, place, and time. She appears well-developed and well-nourished.  HENT:  Left Ear: External ear normal.  Nose: Nose normal.  Mouth/Throat: Oropharynx is clear and moist.       The right TM shows old surgical scars but no erythema or effusion. The canal is red and tender at the distal end   Eyes: Conjunctivae are normal. Pupils are equal, round, and reactive to light.  Neck: Neck supple. No thyromegaly present.  Cardiovascular: Normal rate, regular rhythm, normal heart sounds and intact distal pulses.   Pulmonary/Chest: Effort normal and breath sounds normal.  Lymphadenopathy:    She has no cervical adenopathy.  Neurological: She is alert and oriented to person, place, and time.  Psychiatric: She has a normal mood and affect. Her behavior is normal. Thought content normal.          Assessment & Plan:  Her HTN and ADHD are stable. Increase the Lexapro to 20 mg a day. Use Cortisporin Otic drops for the otitis externa.

## 2012-05-27 ENCOUNTER — Emergency Department (HOSPITAL_COMMUNITY)
Admission: EM | Admit: 2012-05-27 | Discharge: 2012-05-28 | Disposition: A | Discharge status: Home / Self Care | Payer: Self-pay | Attending: Emergency Medicine | Admitting: Emergency Medicine

## 2012-05-27 ENCOUNTER — Encounter (HOSPITAL_COMMUNITY): Payer: Self-pay | Admitting: *Deleted

## 2012-05-27 DIAGNOSIS — F909 Attention-deficit hyperactivity disorder, unspecified type: Secondary | ICD-10-CM | POA: Insufficient documentation

## 2012-05-27 DIAGNOSIS — I10 Essential (primary) hypertension: Secondary | ICD-10-CM | POA: Insufficient documentation

## 2012-05-27 DIAGNOSIS — K029 Dental caries, unspecified: Secondary | ICD-10-CM | POA: Insufficient documentation

## 2012-05-27 DIAGNOSIS — K0889 Other specified disorders of teeth and supporting structures: Secondary | ICD-10-CM

## 2012-05-27 DIAGNOSIS — Z8614 Personal history of Methicillin resistant Staphylococcus aureus infection: Secondary | ICD-10-CM | POA: Insufficient documentation

## 2012-05-27 MED ORDER — HYDROCODONE-ACETAMINOPHEN 5-325 MG PO TABS: 1.0000 | ORAL_TABLET | ORAL | Status: DC | PRN

## 2012-05-27 MED ORDER — AMOXICILLIN 500 MG PO CAPS: 500.0000 mg | ORAL_CAPSULE | Freq: Three times a day (TID) | ORAL | Status: DC

## 2012-05-27 NOTE — ED Provider Notes (Signed)
History     CSN: [id number]  Arrival date & time 05/27/12  2046   First MD Initiated Contact with Patient 05/27/12 2219      Chief Complaint  Patient presents with  . Dental Pain   HPI  History provided by the patient. Patient is a 36 year old female who presents with complaints of left upper molar pain for the past 3 days. Patient reports pain began gradually it was worse with cold water. Since that time pain has increased significantly and is constant. Pain is unchanged with over-the-counter pain medications. Patient denies having any swelling of the mouth or gums. Denies any bleeding or drainage. Denies any fever, chills or sweats.    Past Medical History  Diagnosis Date  . Blood in stool   . Headache   . Hypertension   . ADHD (attention deficit hyperactivity disorder)   . Narcotic abuse   . Anxiety   . Depression   . MRSA (methicillin resistant staph aureus) culture positive     at C section site 05-2009 resolved    Past Surgical History  Procedure Date  . Tubal ligation   . Cesarean section   . Tympanostomy tube placement     rt ear 2009 dr shoemaker  . Tympanoplasty Sept. 2012 and May 2013    right ear twice, per Dr. Ruel Favors at [facility]  . Knee arthroscopy 2012    left knee, per Dr. Thurston Hole     Family History  Problem Relation Age of Onset  . Hypertension    . Diabetes    . Alcohol abuse    . Drug abuse      cocaine addiction     History  Substance Use Topics  . Smoking status: Never Smoker   . Smokeless tobacco: Never Used  . Alcohol Use: No    OB History    Grav Para Term Preterm Abortions TAB SAB Ect Mult Living                  Review of Systems  Constitutional: Negative for fever and chills.  HENT: Positive for dental problem. Negative for sore throat, facial swelling, drooling, mouth sores and trouble swallowing.   Gastrointestinal: Negative for nausea and vomiting.  Skin: Negative for rash.    Allergies  Review of patient's  allergies indicates no known allergies.  Home Medications   Current Outpatient Rx  Name Route Sig Dispense Refill  . ACETAMINOPHEN 500 MG PO TABS Oral Take 1,000 mg by mouth every 6 (six) hours as needed. For pain    . ESCITALOPRAM OXALATE 20 MG PO TABS Oral Take 1 tablet (20 mg total) by mouth daily. 90 tablet 3  . IBUPROFEN 200 MG PO TABS Oral Take 600 mg by mouth every 6 (six) hours as needed. For pain    . IBUPROFEN 200 MG PO TABS Oral Take 800 mg by mouth every 6 (six) hours as needed. For pain    . LOSARTAN POTASSIUM-HCTZ 50-12.5 MG PO TABS Oral Take 1 tablet by mouth daily. 90 tablet 3    BP 143/95  Pulse 114  Temp 97.8 F (36.6 C) (Oral)  Resp 18  SpO2 99%  Physical Exam  Nursing note and vitals reviewed. Constitutional: She is oriented to person, place, and time. She appears well-developed and well-nourished. No distress.  HENT:  Head: Normocephalic.  Mouth/Throat:         Several dental extraction throughout mouth. There is significant decay of the right upper second molar  to the gumline. There is dental care he and fracture to the anterior lateral portion of the left upper first molar. Pain to percussion over this tooth. No swelling adjacent gums.  Neck: Normal range of motion. Neck supple.  Cardiovascular: Normal rate and regular rhythm.   Pulmonary/Chest: Effort normal and breath sounds normal.  Lymphadenopathy:    She has no cervical adenopathy.  Neurological: She is alert and oriented to person, place, and time.  Skin: Skin is warm and dry. No rash noted.  Psychiatric: She has a normal mood and affect. Her behavior is normal.    ED Course  Procedures   Dental Block Performed by: Angus Seller Authorized by: Angus Seller Consent: Verbal consent obtained. Risks and benefits: risks, benefits and alternatives were discussed Consent given by: patient Patient identity confirmed: provided demographic data  Location: Left upper first molar  Local  anesthetic: Bupivacaine 0.5% with epinephrine  Anesthetic total: 1.8 ml  Irrigation method: syringe  Patient tolerance: Patient tolerated the procedure well with no immediate complications. Pain improved.     1. Pain, dental   2. Dental caries       MDM  Patient seen and evaluated. Patient given prescription for pain medicines, antibiotics and dental referral.      Angus Seller, PA 05/28/12 0023

## 2012-05-27 NOTE — ED Notes (Signed)
The pt has had a toothache for 3 days 

## 2012-05-29 NOTE — ED Provider Notes (Signed)
Medical screening examination/treatment/procedure(s) were performed by non-physician practitioner and as supervising physician I was immediately available for consultation/collaboration.   Loren Racer, MD 05/29/12 [phone]

## 2012-06-04 ENCOUNTER — Other Ambulatory Visit: Payer: Self-pay | Admitting: Family Medicine

## 2012-06-04 NOTE — Telephone Encounter (Signed)
Patient called stating that she need a refill of her adderall. Please assist.  °

## 2012-06-04 NOTE — Telephone Encounter (Signed)
Last seen 04/08/12 Last written 12/11/11 x3 rx's

## 2012-06-05 MED ORDER — AMPHETAMINE-DEXTROAMPHETAMINE 30 MG PO TABS: 30.0000 mg | ORAL_TABLET | Freq: Two times a day (BID) | ORAL | Status: DC

## 2012-06-05 NOTE — Telephone Encounter (Signed)
done

## 2012-06-05 NOTE — Telephone Encounter (Signed)
Attempt to call- "the person you are trying to call is not availble- try your call later"  rx's ready for pick up

## 2012-07-13 ENCOUNTER — Other Ambulatory Visit: Payer: Self-pay | Admitting: Family Medicine

## 2012-07-13 NOTE — Telephone Encounter (Signed)
Pt needs new rx generic adderall 30 mg 2 times daily. Pt is out

## 2012-07-14 NOTE — Telephone Encounter (Signed)
I left voice message with below information. 

## 2012-07-14 NOTE — Telephone Encounter (Signed)
NO refills. She should have refills to last until 09-05-12.

## 2012-08-21 ENCOUNTER — Encounter (HOSPITAL_COMMUNITY): Payer: Self-pay | Admitting: Emergency Medicine

## 2012-08-21 ENCOUNTER — Emergency Department (HOSPITAL_COMMUNITY)
Admission: EM | Admit: 2012-08-21 | Discharge: 2012-08-22 | Disposition: A | Discharge status: Home / Self Care | Payer: Medicaid Other | Attending: Emergency Medicine | Admitting: Emergency Medicine

## 2012-08-21 DIAGNOSIS — Z8659 Personal history of other mental and behavioral disorders: Secondary | ICD-10-CM | POA: Insufficient documentation

## 2012-08-21 DIAGNOSIS — Z8614 Personal history of Methicillin resistant Staphylococcus aureus infection: Secondary | ICD-10-CM | POA: Insufficient documentation

## 2012-08-21 DIAGNOSIS — Z8679 Personal history of other diseases of the circulatory system: Secondary | ICD-10-CM | POA: Insufficient documentation

## 2012-08-21 DIAGNOSIS — N390 Urinary tract infection, site not specified: Secondary | ICD-10-CM

## 2012-08-21 DIAGNOSIS — Z79899 Other long term (current) drug therapy: Secondary | ICD-10-CM | POA: Insufficient documentation

## 2012-08-21 DIAGNOSIS — R319 Hematuria, unspecified: Secondary | ICD-10-CM | POA: Insufficient documentation

## 2012-08-21 DIAGNOSIS — Z9851 Tubal ligation status: Secondary | ICD-10-CM | POA: Insufficient documentation

## 2012-08-21 DIAGNOSIS — F1110 Opioid abuse, uncomplicated: Secondary | ICD-10-CM | POA: Insufficient documentation

## 2012-08-21 DIAGNOSIS — I10 Essential (primary) hypertension: Secondary | ICD-10-CM | POA: Insufficient documentation

## 2012-08-21 LAB — URINALYSIS, ROUTINE W REFLEX MICROSCOPIC
Ketones, ur: NEGATIVE mg/dL
Nitrite: NEGATIVE
Protein, ur: NEGATIVE mg/dL
pH: 5 (ref 5.0–8.0)

## 2012-08-21 NOTE — ED Provider Notes (Signed)
History     CSN: [id number]  Arrival date & time 08/21/12  2009   First MD Initiated Contact with Patient 08/21/12 2311      Chief Complaint  Patient presents with  . Back Pain    (Consider location/radiation/quality/duration/timing/severity/associated sxs/prior treatment) HPI Comments: 37 year old female who has a history of a urinary infection in the past presents with a complaint of several days of urinary symptoms. Her initial symptoms or dysuria, this was intermittent, gradually worsening and associated with urinary frequency but only small volume. Over the last 24 hours the symptoms seem to gone away but she has developed a right flank and right lower back pain. This is intermittent, sharp and stabbing in nature, radiates to her right groin and vagina and is associated with nausea but no vomiting. She states that she is not having the pain at this time.  Patient is a 37 y.o. female presenting with back pain. The history is provided by the patient and the spouse.  Back Pain     Past Medical History  Diagnosis Date  . Blood in stool   . Headache   . Hypertension   . ADHD (attention deficit hyperactivity disorder)   . Narcotic abuse   . Anxiety   . Depression   . MRSA (methicillin resistant staph aureus) culture positive     at C section site 05-2009 resolved    Past Surgical History  Procedure Date  . Tubal ligation   . Cesarean section   . Tympanostomy tube placement     rt ear 2009 dr shoemaker  . Tympanoplasty Sept. 2012 and May 2013    right ear twice, per Dr. Ruel Favors at [facility]  . Knee arthroscopy 2012    left knee, per Dr. Thurston Hole     Family History  Problem Relation Age of Onset  . Hypertension    . Diabetes    . Alcohol abuse    . Drug abuse      cocaine addiction     History  Substance Use Topics  . Smoking status: Never Smoker   . Smokeless tobacco: Never Used  . Alcohol Use: No    OB History    Grav Para Term Preterm Abortions TAB  SAB Ect Mult Living                  Review of Systems  Musculoskeletal: Positive for back pain.  All other systems reviewed and are negative.    Allergies  Review of patient's allergies indicates no known allergies.  Home Medications   Current Outpatient Rx  Name  Route  Sig  Dispense  Refill  . ACETAMINOPHEN 500 MG PO TABS   Oral   Take 1,000 mg by mouth every 6 (six) hours as needed. For pain         . AMOXICILLIN 500 MG PO CAPS   Oral   Take 500 mg by mouth 3 (three) times daily.         . AMPHETAMINE-DEXTROAMPHETAMINE 30 MG PO TABS   Oral   Take 1 tablet (30 mg total) by mouth 2 (two) times daily.   60 tablet   0     May fill on 08-05-12   . LOSARTAN POTASSIUM-HCTZ 50-12.5 MG PO TABS   Oral   Take 1 tablet by mouth daily.   90 tablet   3   . AZO TABS PO   Oral   Take 2 tablets by mouth daily.         Marland Kitchen  NAPROXEN 500 MG PO TABS   Oral   Take 1 tablet (500 mg total) by mouth 2 (two) times daily with a meal.   30 tablet   0   . PROMETHAZINE HCL 25 MG PO TABS   Oral   Take 1 tablet (25 mg total) by mouth every 6 (six) hours as needed for nausea.   12 tablet   0   . SULFAMETHOXAZOLE-TRIMETHOPRIM 800-160 MG PO TABS   Oral   Take 1 tablet by mouth every 12 (twelve) hours.   14 tablet   0     BP 133/89  Pulse 90  Temp 97.9 F (36.6 C) (Oral)  Resp 18  SpO2 97%  LMP 08/11/2012  Physical Exam  Nursing note and vitals reviewed. Constitutional: She appears well-developed and well-nourished. No distress.  HENT:  Head: Normocephalic and atraumatic.  Mouth/Throat: Oropharynx is clear and moist. No oropharyngeal exudate.  Eyes: Conjunctivae normal and EOM are normal. Pupils are equal, round, and reactive to light. Right eye exhibits no discharge. Left eye exhibits no discharge. No scleral icterus.  Neck: Normal range of motion. Neck supple. No JVD present. No thyromegaly present.  Cardiovascular: Normal rate, regular rhythm, normal heart  sounds and intact distal pulses.  Exam reveals no gallop and no friction rub.   No murmur heard. Pulmonary/Chest: Effort normal and breath sounds normal. No respiratory distress. She has no wheezes. She has no rales.  Abdominal: Soft. Bowel sounds are normal. She exhibits no distension and no mass. There is no tenderness.       No CVA tenderness  Musculoskeletal: Normal range of motion. She exhibits no edema and no tenderness.  Lymphadenopathy:    She has no cervical adenopathy.  Neurological: She is alert. Coordination normal.  Skin: Skin is warm and dry. No rash noted. No erythema.  Psychiatric: She has a normal mood and affect. Her behavior is normal.    ED Course  Procedures (including critical care time)  Labs Reviewed  URINALYSIS, ROUTINE W REFLEX MICROSCOPIC - Abnormal; Notable for the following:    APPearance CLOUDY (*)     Hgb urine dipstick LARGE (*)     Leukocytes, UA SMALL (*)     All other components within normal limits  URINE MICROSCOPIC-ADD ON - Abnormal; Notable for the following:    Bacteria, UA FEW (*)     All other components within normal limits  URINE CULTURE   Ct Abdomen Pelvis Wo Contrast  08/22/2012  *RADIOLOGY REPORT*  Clinical Data: Hematuria, lower abdominal pain, flank pain on the right.  CT ABDOMEN AND PELVIS WITHOUT CONTRAST  Technique:  Multidetector CT imaging of the abdomen and pelvis was performed following the standard protocol without intravenous contrast.  Comparison: None.  Findings: Lung bases are clear.  No pericardial fluid.  No focal hepatic lesion on this noncontrast exam.  The gallbladder, pancreas, spleen, adrenal glands are normal.  No nephrolithiasis or ureterolithiasis.  No obstructive uropathy.  The stomach, small bowel, appendix, and cecum are normal.  The colon rectosigmoid colon are normal.  Abdominal aorta is normal caliber.  No retroperitoneal periportal lymphadenopathy.  There is no free fluid the pelvis.  No distal ureteral stones  or bladder stones.  There is a linear density adjacent to the right cornu of the uterus which represents a contraceptive device.  No device on the left.  There is no pelvic lymphadenopathy. Review of bone windows demonstrates no aggressive osseous lesions.  IMPRESSION:  1.  No nephrolithiasis  or ureterolithiasis.  No obstructive uropathy. 2. No clear explanation for abdominal pain.   Original Report Authenticated By: Genevive Bi, M.D.      1. UTI (urinary tract infection)   2. Hematuria       MDM  At this time the patient overall is well-appearing and has no pain. It appears to be a colicky pain I would consider ureteral colic to be high on the differential. I would also consider urinary infection however a urinary sample shows few bacteria and only 3-6 white blood cells whereas at 11-20 red blood cells. CT scan of the abdomen and pelvis without contrast is pending, the patient declines pain medications at this time, reevaluate.  CT scan reviewed, there is no signs of ureterolithiasis or any other significant intra-abdominal pathology. She does have mild hematuria and likely has a urinary infection which may be partially treated from the amoxicillin. Will have Bactrim, precautions given, patient understands indications for return and will followup with family Dr.   Discharge Prescriptions include:   Naprosyn  Bactrim       Vida Roller, MD 08/22/12 [phone]

## 2012-08-21 NOTE — ED Notes (Signed)
Pt. Taking amoxicillin for rt. Ear infection for 7 days; is to take the medication for 10 days.

## 2012-08-21 NOTE — ED Notes (Signed)
Pt c/o urinary symptoms onset 4 days ago.  Pt c/o hematuria, lower abd pain and right flank pain also c/o urinary frequency

## 2012-08-22 ENCOUNTER — Emergency Department (HOSPITAL_COMMUNITY): Payer: Medicaid Other

## 2012-08-22 MED ORDER — NAPROXEN 500 MG PO TABS: 500.0000 mg | ORAL_TABLET | Freq: Two times a day (BID) | ORAL | Status: DC

## 2012-08-22 MED ORDER — SULFAMETHOXAZOLE-TRIMETHOPRIM 800-160 MG PO TABS: 1.0000 | ORAL_TABLET | Freq: Two times a day (BID) | ORAL | Status: DC

## 2012-08-22 MED ORDER — TRAMADOL HCL 50 MG PO TABS
50.0000 mg | ORAL_TABLET | Freq: Once | ORAL | Status: AC
  Administered 2012-08-22: 50 mg via ORAL
  Filled 2012-08-22: qty 1

## 2012-08-22 MED ORDER — PROMETHAZINE HCL 25 MG PO TABS: 25.0000 mg | ORAL_TABLET | Freq: Four times a day (QID) | ORAL | Status: DC | PRN

## 2012-08-22 MED ORDER — SULFAMETHOXAZOLE-TMP DS 800-160 MG PO TABS
1.0000 | ORAL_TABLET | Freq: Once | ORAL | Status: AC
  Administered 2012-08-22: 1 via ORAL
  Filled 2012-08-22: qty 1

## 2012-08-24 LAB — URINE CULTURE

## 2012-08-25 ENCOUNTER — Telehealth (HOSPITAL_COMMUNITY): Payer: Self-pay | Admitting: Emergency Medicine

## 2012-08-25 NOTE — ED Notes (Signed)
Results received from [facility]. (+) URNC -> 20,000 colonies, E Coli.  Given Rx in ED for Sulfa Trimeth -> Resistant to the same.  Chart to MD office for review.

## 2012-08-26 ENCOUNTER — Telehealth (HOSPITAL_COMMUNITY): Payer: Self-pay | Admitting: Emergency Medicine

## 2012-08-26 NOTE — ED Notes (Signed)
Chart reviewed by B. Laveda Norman PA "If patient's dysuria has resolved then no tx necessary.  If pt continues to have dysuria, then recommend "Cipro 500 mg po BID x 5 days"  1/15 LVM (H) requesting callback.

## 2012-08-29 ENCOUNTER — Telehealth (HOSPITAL_COMMUNITY): Payer: Self-pay | Admitting: Emergency Medicine

## 2012-08-29 NOTE — ED Notes (Signed)
Unable to contact patient via phone. Sent letter. °

## 2012-10-29 ENCOUNTER — Telehealth: Payer: Self-pay | Admitting: Family Medicine

## 2012-10-29 NOTE — Telephone Encounter (Signed)
Patient called stating that she need a refill of her adderall 30 mg 1po bid. Please assist.

## 2012-10-30 MED ORDER — AMPHETAMINE-DEXTROAMPHETAMINE 30 MG PO TABS: 30.0000 mg | ORAL_TABLET | Freq: Two times a day (BID) | ORAL | Status: DC

## 2012-10-30 NOTE — Telephone Encounter (Signed)
done

## 2012-10-30 NOTE — Telephone Encounter (Signed)
Script is ready for pick up and I tried to reach pt by phone, no answer. 

## 2013-01-11 ENCOUNTER — Telehealth: Payer: Self-pay | Admitting: Family Medicine

## 2013-01-11 NOTE — Telephone Encounter (Signed)
Pt called to request a 3 month supply of her amphetamine-dextroamphetamine (ADDERALL) 30 MG tablet. Please assist.

## 2013-01-12 NOTE — Telephone Encounter (Signed)
I left voice message with below information. 

## 2013-01-12 NOTE — Telephone Encounter (Signed)
NO too early. She is not due until 01-30-13

## 2013-01-19 ENCOUNTER — Emergency Department (HOSPITAL_COMMUNITY)
Admission: EM | Admit: 2013-01-19 | Discharge: 2013-01-19 | Disposition: A | Discharge status: Home / Self Care | Payer: Medicaid Other | Source: Home / Self Care | Attending: Emergency Medicine | Admitting: Emergency Medicine

## 2013-01-19 ENCOUNTER — Encounter (HOSPITAL_COMMUNITY): Payer: Self-pay

## 2013-01-19 DIAGNOSIS — K089 Disorder of teeth and supporting structures, unspecified: Secondary | ICD-10-CM

## 2013-01-19 DIAGNOSIS — L0100 Impetigo, unspecified: Secondary | ICD-10-CM

## 2013-01-19 DIAGNOSIS — K0889 Other specified disorders of teeth and supporting structures: Secondary | ICD-10-CM

## 2013-01-19 MED ORDER — AMOXICILLIN-POT CLAVULANATE 875-125 MG PO TABS: 1.0000 | ORAL_TABLET | Freq: Two times a day (BID) | ORAL | Status: DC

## 2013-01-19 MED ORDER — MUPIROCIN CALCIUM 2 % EX CREA: TOPICAL_CREAM | Freq: Three times a day (TID) | CUTANEOUS | Status: DC

## 2013-01-19 MED ORDER — HYDROCODONE-ACETAMINOPHEN 5-325 MG PO TABS: 2.0000 | ORAL_TABLET | ORAL | Status: DC | PRN

## 2013-01-19 NOTE — ED Provider Notes (Signed)
History     CSN: [id number]  Arrival date & time 01/19/13  1208   First MD Initiated Contact with Patient 01/19/13 1307      Chief Complaint  Patient presents with  . Rash    (Consider location/radiation/quality/duration/timing/severity/associated sxs/prior treatment) Patient is a 37 y.o. female presenting with rash. The history is provided by the patient.  Rash Pain location: face. Pain severity:  Mild Onset quality:  Gradual Duration: months. Timing:  Constant Progression:  Worsening Chronicity:  New Relieved by:  Nothing Worsened by:  Nothing tried Ineffective treatments:  None tried  Pt reports she has a bad rash on her face.   Pt also complains of dental pain. Past Medical History  Diagnosis Date  . Blood in stool   . Headache(784.0)   . Hypertension   . ADHD (attention deficit hyperactivity disorder)   . Narcotic abuse   . Anxiety   . Depression   . MRSA (methicillin resistant staph aureus) culture positive     at C section site 05-2009 resolved    Past Surgical History  Procedure Laterality Date  . Tubal ligation    . Cesarean section    . Tympanostomy tube placement      rt ear 2009 dr shoemaker  . Tympanoplasty  Sept. 2012 and May 2013    right ear twice, per Dr. Ruel Favors at [facility]  . Knee arthroscopy  2012    left knee, per Dr. Thurston Hole     Family History  Problem Relation Age of Onset  . Hypertension    . Diabetes    . Alcohol abuse    . Drug abuse      cocaine addiction     History  Substance Use Topics  . Smoking status: Never Smoker   . Smokeless tobacco: Never Used  . Alcohol Use: No    OB History   Grav Para Term Preterm Abortions TAB SAB Ect Mult Living                  Review of Systems  HENT: Positive for facial swelling.   Skin: Positive for rash.  All other systems reviewed and are negative.    Allergies  Review of patient's allergies indicates no known allergies.  Home Medications   Current Outpatient  Rx  Name  Route  Sig  Dispense  Refill  . acetaminophen (TYLENOL) 500 MG tablet   Oral   Take 1,000 mg by mouth every 6 (six) hours as needed. For pain         . amoxicillin (AMOXIL) 500 MG capsule   Oral   Take 500 mg by mouth 3 (three) times daily.         Marland Kitchen amphetamine-dextroamphetamine (ADDERALL) 30 MG tablet   Oral   Take 1 tablet (30 mg total) by mouth 2 (two) times daily.   60 tablet   0     May fill on 12-30-12   . losartan-hydrochlorothiazide (HYZAAR) 50-12.5 MG per tablet   Oral   Take 1 tablet by mouth daily.   90 tablet   3   . naproxen (NAPROSYN) 500 MG tablet   Oral   Take 1 tablet (500 mg total) by mouth 2 (two) times daily with a meal.   30 tablet   0   . Phenazopyridine HCl (AZO TABS PO)   Oral   Take 2 tablets by mouth daily.         . promethazine (PHENERGAN) 25 MG tablet  Oral   Take 1 tablet (25 mg total) by mouth every 6 (six) hours as needed for nausea.   12 tablet   0   . sulfamethoxazole-trimethoprim (SEPTRA DS) 800-160 MG per tablet   Oral   Take 1 tablet by mouth every 12 (twelve) hours.   14 tablet   0     BP 138/83  Pulse 95  Temp(Src) 97.6 F (36.4 C) (Oral)  Resp 16  SpO2 100%  Physical Exam  Nursing note and vitals reviewed. Constitutional: She appears well-developed and well-nourished.  HENT:  Head: Normocephalic and atraumatic.  Right Ear: External ear normal.  Left Ear: External ear normal.  Mouth/Throat: Oropharynx is clear and moist.  Multiple erythematous pustules face,  Looks like impetigo swollen area left lower mouth  Eyes: Conjunctivae and EOM are normal. Pupils are equal, round, and reactive to light.  Cardiovascular: Normal rate.   Pulmonary/Chest: Effort normal.  Skin: Rash noted.    ED Course  Procedures (including critical care time)  Labs Reviewed - No data to display No results found.   No diagnosis found.    MDM  Augmentin,  Bactroban and hydrocodone.   Pt advised follow up with  Dr. Oswaldo Done for toothache        Lonia Skinner [place], [place] 01/19/13 1314

## 2013-01-19 NOTE — ED Notes (Signed)
C/o 2 month duration of infected sore on right side of face, causing her eye to be swollen this AM; reports some relief w warm soaks. Also concern for toothache on left side of face

## 2013-01-19 NOTE — ED Provider Notes (Signed)
Medical screening examination/treatment/procedure(s) were performed by non-physician practitioner and as supervising physician I was immediately available for consultation/collaboration.  Raynald Blend, MD 01/19/13 1334

## 2013-01-27 ENCOUNTER — Telehealth: Payer: Self-pay | Admitting: Family Medicine

## 2013-01-27 NOTE — Telephone Encounter (Signed)
PT called to request a 3 month refill of her amphetamine-dextroamphetamine (ADDERALL) 30 MG tablet. Please assist.

## 2013-01-28 MED ORDER — AMPHETAMINE-DEXTROAMPHETAMINE 30 MG PO TABS: 30.0000 mg | ORAL_TABLET | Freq: Two times a day (BID) | ORAL | Status: DC

## 2013-01-28 NOTE — Telephone Encounter (Signed)
done

## 2013-01-28 NOTE — Telephone Encounter (Signed)
PT stopped by this afternoon to pick up her RX, which couldn't be located. Please assist.

## 2013-01-29 NOTE — Telephone Encounter (Signed)
Script is ready for pick up, tried to reach pt by phone and no answer. 

## 2013-01-31 ENCOUNTER — Other Ambulatory Visit: Payer: Self-pay | Admitting: Family Medicine

## 2013-04-05 ENCOUNTER — Other Ambulatory Visit: Payer: Self-pay | Admitting: Family Medicine

## 2013-04-27 ENCOUNTER — Telehealth: Payer: Self-pay | Admitting: Family Medicine

## 2013-04-27 NOTE — Telephone Encounter (Signed)
Pt called to request a 3 month refill of her amphetamine-dextroamphetamine (ADDERALL) 30 MG tablet. Please assist.  °

## 2013-04-28 MED ORDER — AMPHETAMINE-DEXTROAMPHETAMINE 30 MG PO TABS: 30.0000 mg | ORAL_TABLET | Freq: Two times a day (BID) | ORAL | Status: DC

## 2013-04-28 NOTE — Telephone Encounter (Signed)
Done for one month only. She needs an OV soon  

## 2013-04-28 NOTE — Telephone Encounter (Signed)
Script is ready for pick up and I left voice message for pt.

## 2013-05-04 ENCOUNTER — Other Ambulatory Visit: Payer: Self-pay | Admitting: Family Medicine

## 2013-05-04 ENCOUNTER — Telehealth: Payer: Self-pay | Admitting: Family Medicine

## 2013-05-04 NOTE — Telephone Encounter (Signed)
Pt needs refill of escitalopram (LEXAPRO)  Pt states it is 20 mg . (I only see the 10 mg)Pt is out of meds and needs asap if possible. Pt has made fup for her meds for Monday, 9/29. Can you fill for her? Pharm: [facility]/ [address]

## 2013-05-05 NOTE — Telephone Encounter (Signed)
Script was sent e-scribe 

## 2013-05-05 NOTE — Telephone Encounter (Signed)
Call in Lexapro 20 mg daily for #30 only

## 2013-05-10 ENCOUNTER — Encounter: Payer: Self-pay | Admitting: Family Medicine

## 2013-05-10 ENCOUNTER — Ambulatory Visit (INDEPENDENT_AMBULATORY_CARE_PROVIDER_SITE_OTHER): Payer: Medicaid Other | Admitting: Family Medicine

## 2013-05-10 VITALS — BP 130/86 | HR 127 | Temp 98.3°F

## 2013-05-10 DIAGNOSIS — Z23 Encounter for immunization: Secondary | ICD-10-CM

## 2013-05-10 DIAGNOSIS — L0292 Furuncle, unspecified: Secondary | ICD-10-CM

## 2013-05-10 DIAGNOSIS — F909 Attention-deficit hyperactivity disorder, unspecified type: Secondary | ICD-10-CM

## 2013-05-10 MED ORDER — AMPHETAMINE-DEXTROAMPHETAMINE 30 MG PO TABS: 30.0000 mg | ORAL_TABLET | Freq: Two times a day (BID) | ORAL | Status: DC

## 2013-05-10 MED ORDER — MUPIROCIN 2 % EX OINT: TOPICAL_OINTMENT | Freq: Three times a day (TID) | CUTANEOUS | Status: DC

## 2013-05-10 MED ORDER — ESCITALOPRAM OXALATE 20 MG PO TABS: ORAL_TABLET | ORAL | Status: DC

## 2013-05-10 MED ORDER — DOXYCYCLINE HYCLATE 100 MG PO CAPS: 100.0000 mg | ORAL_CAPSULE | Freq: Two times a day (BID) | ORAL | Status: AC

## 2013-05-10 MED ORDER — MUPIROCIN CALCIUM 2 % EX CREA: TOPICAL_CREAM | Freq: Three times a day (TID) | CUTANEOUS | Status: DC

## 2013-05-10 NOTE — Addendum Note (Signed)
Addended by: Aniceto Boss A on: 05/10/2013 03:16 PM   Modules accepted: Orders

## 2013-05-10 NOTE — Addendum Note (Signed)
Addended by: Gershon Crane A on: 05/10/2013 03:42 PM   Modules accepted: Orders, Medications

## 2013-05-10 NOTE — Progress Notes (Signed)
  Subjective:    Patient ID: Brittney Manning, female    DOB: 1976/04/13, 37 y.o.   MRN: [id number]  HPI Here for refills and to ask about a rash on her face which appeared 3 weeks ago. She feels fine in general, no fevers. The rash consists of isolated red pimples which then grow to boils that open and drain. She also has one on the right forearm and one on the abdomen.    Review of Systems  Constitutional: Negative.   Skin: Positive for rash.  Neurological: Negative.        Objective:   Physical Exam  Constitutional: She is oriented to person, place, and time. She appears well-developed and well-nourished.  Neurological: She is alert and oriented to person, place, and time.  Skin:  Scattered remnants of blisters over the face   Psychiatric: She has a normal mood and affect. Her behavior is normal. Thought content normal.          Assessment & Plan:  Staph infection, possibly MRSA. Cover with Bactroban and Doxycycline.

## 2013-08-06 ENCOUNTER — Other Ambulatory Visit: Payer: Self-pay | Admitting: Family Medicine

## 2013-08-06 ENCOUNTER — Telehealth: Payer: Self-pay | Admitting: Family Medicine

## 2013-08-06 NOTE — Telephone Encounter (Signed)
Pt request  3 mo amphetamine-dextroamphetamine (ADDERALL) 30 MG tablet 1 /bid  Also losartan-hydrochlorothiazide (HYZAAR) 50-12.5 MG per tablet  1/ day  90 day and at [facility]/ [address]

## 2013-08-13 ENCOUNTER — Encounter: Payer: Self-pay | Admitting: Family Medicine

## 2013-08-13 MED ORDER — AMPHETAMINE-DEXTROAMPHETAMINE 30 MG PO TABS: 30.0000 mg | ORAL_TABLET | Freq: Two times a day (BID) | ORAL | Status: DC

## 2013-08-13 MED ORDER — LOSARTAN POTASSIUM-HCTZ 50-12.5 MG PO TABS: ORAL_TABLET | ORAL | Status: DC

## 2013-08-13 NOTE — Telephone Encounter (Signed)
Wrote for Adderall for one month only. She needs testing and a contract. Also refill Hyzaar for one year

## 2013-08-13 NOTE — Telephone Encounter (Signed)
Pt called to check status of medication advise of the previous notes, and verified phone number.  Per pt phone number is [phone].

## 2013-08-13 NOTE — Telephone Encounter (Signed)
Correction: Typo error the phone number verifed with pt was [phone].

## 2013-08-13 NOTE — Telephone Encounter (Signed)
Previous calls made lists contact number as [phone] and not [phone]. Number changed in chart and message left for pt to call back

## 2013-08-13 NOTE — Telephone Encounter (Signed)
Unable to reach pt via phone. Incorrect contact listed in chart.

## 2013-08-15 ENCOUNTER — Encounter (HOSPITAL_COMMUNITY): Payer: Self-pay | Admitting: Emergency Medicine

## 2013-08-15 ENCOUNTER — Emergency Department (HOSPITAL_COMMUNITY)
Admission: EM | Admit: 2013-08-15 | Discharge: 2013-08-15 | Disposition: A | Discharge status: Home / Self Care | Payer: Medicaid Other | Attending: Emergency Medicine | Admitting: Emergency Medicine

## 2013-08-15 DIAGNOSIS — Z79899 Other long term (current) drug therapy: Secondary | ICD-10-CM | POA: Insufficient documentation

## 2013-08-15 DIAGNOSIS — I10 Essential (primary) hypertension: Secondary | ICD-10-CM | POA: Insufficient documentation

## 2013-08-15 DIAGNOSIS — L0293 Carbuncle, unspecified: Secondary | ICD-10-CM

## 2013-08-15 DIAGNOSIS — L03119 Cellulitis of unspecified part of limb: Secondary | ICD-10-CM

## 2013-08-15 DIAGNOSIS — Z8614 Personal history of Methicillin resistant Staphylococcus aureus infection: Secondary | ICD-10-CM | POA: Insufficient documentation

## 2013-08-15 DIAGNOSIS — F3289 Other specified depressive episodes: Secondary | ICD-10-CM | POA: Insufficient documentation

## 2013-08-15 DIAGNOSIS — K089 Disorder of teeth and supporting structures, unspecified: Secondary | ICD-10-CM | POA: Insufficient documentation

## 2013-08-15 DIAGNOSIS — K0889 Other specified disorders of teeth and supporting structures: Secondary | ICD-10-CM

## 2013-08-15 DIAGNOSIS — N61 Mastitis without abscess: Secondary | ICD-10-CM | POA: Insufficient documentation

## 2013-08-15 DIAGNOSIS — F909 Attention-deficit hyperactivity disorder, unspecified type: Secondary | ICD-10-CM | POA: Insufficient documentation

## 2013-08-15 DIAGNOSIS — L02419 Cutaneous abscess of limb, unspecified: Secondary | ICD-10-CM | POA: Insufficient documentation

## 2013-08-15 DIAGNOSIS — L0292 Furuncle, unspecified: Secondary | ICD-10-CM | POA: Insufficient documentation

## 2013-08-15 DIAGNOSIS — F329 Major depressive disorder, single episode, unspecified: Secondary | ICD-10-CM | POA: Insufficient documentation

## 2013-08-15 DIAGNOSIS — F411 Generalized anxiety disorder: Secondary | ICD-10-CM | POA: Insufficient documentation

## 2013-08-15 MED ORDER — HYDROCODONE-ACETAMINOPHEN 5-325 MG PO TABS: 1.0000 | ORAL_TABLET | ORAL | Status: DC | PRN

## 2013-08-15 MED ORDER — CLINDAMYCIN HCL 150 MG PO CAPS: 150.0000 mg | ORAL_CAPSULE | Freq: Four times a day (QID) | ORAL | Status: DC

## 2013-08-15 NOTE — ED Provider Notes (Signed)
CSN: [id number]     Arrival date & time 08/15/13  1720 History  This chart was scribed for Marlon Peliffany Zayda Angell, PA-C, working with Rolland PorterMark James, MD, by Ardelia Memsylan Malpass ED Scribe. This patient was seen in room WTR7/WTR7 and the patient's care was started at 6:06 PM.    Chief Complaint  Patient presents with  . Dental Pain  . Abrasion    The history is provided by the patient. No language interpreter was used.    HPI Comments: Brittney Manning is a 38 y.o. female with a history of MRSA who presents to the Emergency Department complaining of multiple small abscesses over the past few days. She states she gets abscesses "all over", and is currently complaining of abscesses to her right breast and right lower leg. She is also complaining of gradually worsening, "aching" left upper dental pain over the past 3 days, associated with a tooth in that area which broke 2 weeks ago. She states that she has been taking Tylenol without relief. Pt states that she has no known medication allergies.   Past Medical History  Diagnosis Date  . Blood in stool   . Headache(784.0)   . Hypertension   . ADHD (attention deficit hyperactivity disorder)   . Narcotic abuse   . Anxiety   . Depression   . MRSA (methicillin resistant staph aureus) culture positive     at C section site 05-2009 resolved   Past Surgical History  Procedure Laterality Date  . Tubal ligation    . Cesarean section    . Tympanostomy tube placement      rt ear 2009 dr shoemaker  . Tympanoplasty  Sept. 2012 and May 2013    right ear [facility], [facility]  . Knee arthroscopy  2012    left knee, per Dr. Thurston HoleWainer    Family History  Problem Relation Age of Onset  . Hypertension    . Diabetes    . Alcohol abuse    . Drug abuse      cocaine addiction    History  Substance Use Topics  . Smoking status: Never Smoker   . Smokeless tobacco: Never Used  . Alcohol Use: No   OB History   Grav Para Term Preterm Abortions TAB  SAB Ect Mult Living                 Review of Systems  HENT: Positive for dental problem.   Skin: Positive for wound (abscesses).  All other systems reviewed and are negative.   Allergies  Review of patient's allergies indicates no known allergies.  Home Medications   Current Outpatient Rx  Name  Route  Sig  Dispense  Refill  . acetaminophen (TYLENOL) 500 MG tablet   Oral   Take 500 mg by mouth every 6 (six) hours as needed. For pain         . amphetamine-dextroamphetamine (ADDERALL) 30 MG tablet   Oral   Take 1 tablet (30 mg total) by mouth 2 (two) times daily.   60 tablet   0   . clindamycin (CLEOCIN) 150 MG capsule   Oral   Take 1 capsule (150 mg total) by mouth every 6 (six) hours.   28 capsule   0   . escitalopram (LEXAPRO) 20 MG tablet      TAKE 1 TABLET BY MOUTH DAILY   90 tablet   3   . HYDROcodone-acetaminophen (NORCO/VICODIN) 5-325 MG per tablet   Oral  Take 1-2 tablets by mouth every 4 (four) hours as needed.   20 tablet   0   . losartan-hydrochlorothiazide (HYZAAR) 50-12.5 MG per tablet      TAKE 1 TABLET BY MOUTH EVERY DAY   90 tablet   3   . mupirocin ointment (BACTROBAN) 2 %   Topical   Apply topically 3 (three) times daily.   30 g   5    Triage Vitals: BP 144/88  Pulse 119  Temp(Src) 97.7 F (36.5 C) (Oral)  Resp 20  SpO2 99%  Physical Exam  Nursing note and vitals reviewed. Constitutional: She is oriented to person, place, and time. She appears well-developed and well-nourished. No distress.  HENT:  Head: Normocephalic and atraumatic.  Mouth/Throat: Uvula is midline, oropharynx is clear and moist and mucous membranes are normal. Normal dentition. Dental caries (Pts tooth shows no obvious abscess but moderate to severe tenderness to palpation of marked tooth) present. No uvula swelling.    Eyes: EOM are normal. Pupils are equal, round, and reactive to light.  Neck: Trachea normal, normal range of motion and full passive  range of motion without pain. Neck supple. No tracheal deviation present.  Cardiovascular: Normal rate, regular rhythm, normal heart sounds and normal pulses.   Pulmonary/Chest: Effort normal and breath sounds normal. No respiratory distress. Chest wall is not dull to percussion. She exhibits no tenderness, no crepitus, no edema, no deformity and no retraction.    Abdominal: Soft. Normal appearance.  Musculoskeletal: Normal range of motion.  Neurological: She is alert and oriented to person, place, and time. She has normal strength.  Skin: Skin is warm, dry and intact. She is not diaphoretic.  Psychiatric: She has a normal mood and affect. Her speech is normal and behavior is normal. Cognition and memory are normal.    ED Course  Procedures (including critical care time)  DIAGNOSTIC STUDIES: Oxygen Saturation is 99% on RA, normal by my interpretation.    COORDINATION OF CARE: 6:10 PM- Discussed plan for discharge with Norco and Clindamycin. Will also give pt resources for follow-up. Pt advised of plan for treatment and pt agrees.  Clindamycin to treat skin infection and dental pain.  Labs Review Labs Reviewed - No data to display Imaging Review No results found.  EKG Interpretation   None       MDM   1. Pain, dental   2. Recurrent boils    37 y.o.Brittney Manning's evaluation in the Emergency Department is complete. It has been determined that no acute conditions requiring further emergency intervention are present at this time. The patient/guardian have been advised of the diagnosis and plan. We have discussed signs and symptoms that warrant return to the ED, such as changes or worsening in symptoms.  Vital signs are stable at discharge. Filed Vitals:   08/15/13 1722  BP: 144/88  Pulse: 119  Temp: 97.7 F (36.5 C)  Resp: 20    Patient/guardian has voiced understanding and agreed to follow-up with the PCP or specialist.  I personally performed the services  described in this documentation, which was scribed in my presence. The recorded information has been reviewed and is accurate.   Dorthula Matas, PA-C 08/15/13 1828

## 2013-08-15 NOTE — ED Notes (Signed)
Pt complains of pain in L upper molar. L upper molar not intact with purulent drainage, pt states it broke off a week ago. Pt also complains of several abrasions on body that are not healing.

## 2013-08-15 NOTE — Discharge Instructions (Signed)
Abscess An abscess is an infected area that contains a collection of pus and debris.It can occur in almost any part of the body. An abscess is also known as a furuncle or boil. CAUSES  An abscess occurs when tissue gets infected. This can occur from blockage of oil or sweat glands, infection of hair follicles, or a minor injury to the skin. As the body tries to fight the infection, pus collects in the area and creates pressure under the skin. This pressure causes pain. People with weakened immune systems have difficulty fighting infections and get certain abscesses more often.  SYMPTOMS Usually an abscess develops on the skin and becomes a painful mass that is red, warm, and tender. If the abscess forms under the skin, you may feel a moveable soft area under the skin. Some abscesses break open (rupture) on their own, but most will continue to get worse without care. The infection can spread deeper into the body and eventually into the bloodstream, causing you to feel ill.  DIAGNOSIS  Your caregiver will take your medical history and perform a physical exam. A sample of fluid may also be taken from the abscess to determine what is causing your infection. TREATMENT  Your caregiver may prescribe antibiotic medicines to fight the infection. However, taking antibiotics alone usually does not cure an abscess. Your caregiver may need to make a small cut (incision) in the abscess to drain the pus. In some cases, gauze is packed into the abscess to reduce pain and to continue draining the area. HOME CARE INSTRUCTIONS   Only take over-the-counter or prescription medicines for pain, discomfort, or fever as directed by your caregiver.  If you were prescribed antibiotics, take them as directed. Finish them even if you start to feel better.  If gauze is used, follow your caregiver's directions for changing the gauze.  To avoid spreading the infection:  Keep your draining abscess covered with a  bandage.  Wash your hands well.  Do not share personal care items, towels, or whirlpools with others.  Avoid skin contact with others.  Keep your skin and clothes clean around the abscess.  Keep all follow-up appointments as directed by your caregiver. SEEK MEDICAL CARE IF:   You have increased pain, swelling, redness, fluid drainage, or bleeding.  You have muscle aches, chills, or a general ill feeling.  You have a fever. MAKE SURE YOU:   Understand these instructions.  Will watch your condition.  Will get help right away if you are not doing well or get worse. Document Released: 05/08/2005 Document Revised: 01/28/2012 Document Reviewed: 10/11/2011 [facility] Patient Information 2014 [place].  Dental Pain A tooth ache may be caused by cavities (tooth decay). Cavities expose the nerve of the tooth to air and hot or cold temperatures. It may come from an infection or abscess (also called a boil or furuncle) around your tooth. It is also often caused by dental caries (tooth decay). This causes the pain you are having. DIAGNOSIS  Your caregiver can diagnose this problem by exam. TREATMENT   If caused by an infection, it may be treated with medications which kill germs (antibiotics) and pain medications as prescribed by your caregiver. Take medications as directed.  Only take over-the-counter or prescription medicines for pain, discomfort, or fever as directed by your caregiver.  Whether the tooth ache today is caused by infection or dental disease, you should see your dentist as soon as possible for further care. SEEK MEDICAL CARE IF: The exam  and treatment you received today has been provided on an emergency basis only. This is not a substitute for complete medical or dental care. If your problem worsens or new problems (symptoms) appear, and you are unable to meet with your dentist, call or return to this location. SEEK IMMEDIATE MEDICAL CARE IF:   You have a  fever.  You develop redness and swelling of your face, jaw, or neck.  You are unable to open your mouth.  You have severe pain uncontrolled by pain medicine. MAKE SURE YOU:   Understand these instructions.  Will watch your condition.  Will get help right away if you are not doing well or get worse. Document Released: 07/29/2005 Document Revised: 10/21/2011 Document Reviewed: 03/16/2008 [facility] Patient Information 2014 [place], [place].

## 2013-08-17 ENCOUNTER — Telehealth: Payer: Self-pay | Admitting: Family Medicine

## 2013-08-17 NOTE — Telephone Encounter (Signed)
I received a denial for Hyzaar.  Pt must try and fail at least one of the following: Benazepril, Lisinopril, Captopril, Enalapril, Ramipril.

## 2013-08-19 NOTE — Telephone Encounter (Signed)
Stop the Hyzaar and switch to Lisinopril 20 mg a day. Call in one year supply

## 2013-08-19 NOTE — Telephone Encounter (Signed)
I left a voice message for pt to return my call, just want to make sure pt wants new script and also either a 30 or 90 day supply and to [facility]?

## 2013-08-20 MED ORDER — LISINOPRIL 20 MG PO TABS: 20.0000 mg | ORAL_TABLET | Freq: Every day | ORAL | Status: AC

## 2013-08-20 NOTE — Telephone Encounter (Signed)
I spoke with pt and sent new script e-scribe. 

## 2013-08-25 NOTE — ED Provider Notes (Signed)
Medical screening examination/treatment/procedure(s) were performed by non-physician practitioner and as supervising physician I was immediately available for consultation/collaboration.  EKG Interpretation   None         Rolland PorterMark Winnifred Dufford, MD 08/25/13 0020

## 2013-09-07 ENCOUNTER — Telehealth: Payer: Self-pay | Admitting: Family Medicine

## 2013-09-07 NOTE — Telephone Encounter (Signed)
Pt requesting refill of amphetamine-dextroamphetamine (ADDERALL) 30 MG tablet °

## 2013-09-08 ENCOUNTER — Encounter: Payer: Self-pay | Admitting: Family Medicine

## 2013-09-09 MED ORDER — AMPHETAMINE-DEXTROAMPHETAMINE 30 MG PO TABS: 30.0000 mg | ORAL_TABLET | Freq: Two times a day (BID) | ORAL | Status: DC

## 2013-09-09 NOTE — Telephone Encounter (Signed)
done

## 2013-09-09 NOTE — Telephone Encounter (Signed)
Script is ready for pick up, tried to reach pt and no answer.  

## 2013-10-05 ENCOUNTER — Other Ambulatory Visit: Payer: Self-pay | Admitting: Family Medicine

## 2013-11-13 ENCOUNTER — Emergency Department (HOSPITAL_COMMUNITY)
Admission: EM | Admit: 2013-11-13 | Discharge: 2013-11-13 | Disposition: A | Discharge status: Home / Self Care | Payer: Medicaid Other | Attending: Emergency Medicine | Admitting: Emergency Medicine

## 2013-11-13 ENCOUNTER — Encounter (HOSPITAL_COMMUNITY): Payer: Self-pay | Admitting: Emergency Medicine

## 2013-11-13 DIAGNOSIS — N390 Urinary tract infection, site not specified: Secondary | ICD-10-CM

## 2013-11-13 DIAGNOSIS — F3289 Other specified depressive episodes: Secondary | ICD-10-CM | POA: Insufficient documentation

## 2013-11-13 DIAGNOSIS — I10 Essential (primary) hypertension: Secondary | ICD-10-CM | POA: Insufficient documentation

## 2013-11-13 DIAGNOSIS — Z3202 Encounter for pregnancy test, result negative: Secondary | ICD-10-CM | POA: Insufficient documentation

## 2013-11-13 DIAGNOSIS — Z9851 Tubal ligation status: Secondary | ICD-10-CM | POA: Insufficient documentation

## 2013-11-13 DIAGNOSIS — F909 Attention-deficit hyperactivity disorder, unspecified type: Secondary | ICD-10-CM | POA: Insufficient documentation

## 2013-11-13 DIAGNOSIS — R000 Tachycardia, unspecified: Secondary | ICD-10-CM | POA: Insufficient documentation

## 2013-11-13 DIAGNOSIS — Z79899 Other long term (current) drug therapy: Secondary | ICD-10-CM | POA: Insufficient documentation

## 2013-11-13 DIAGNOSIS — R51 Headache: Secondary | ICD-10-CM | POA: Insufficient documentation

## 2013-11-13 DIAGNOSIS — F411 Generalized anxiety disorder: Secondary | ICD-10-CM | POA: Insufficient documentation

## 2013-11-13 DIAGNOSIS — Z9889 Other specified postprocedural states: Secondary | ICD-10-CM | POA: Insufficient documentation

## 2013-11-13 DIAGNOSIS — Z8614 Personal history of Methicillin resistant Staphylococcus aureus infection: Secondary | ICD-10-CM | POA: Insufficient documentation

## 2013-11-13 DIAGNOSIS — Z8719 Personal history of other diseases of the digestive system: Secondary | ICD-10-CM | POA: Insufficient documentation

## 2013-11-13 DIAGNOSIS — F329 Major depressive disorder, single episode, unspecified: Secondary | ICD-10-CM | POA: Insufficient documentation

## 2013-11-13 DIAGNOSIS — R110 Nausea: Secondary | ICD-10-CM | POA: Insufficient documentation

## 2013-11-13 LAB — URINALYSIS, ROUTINE W REFLEX MICROSCOPIC
Glucose, UA: NEGATIVE mg/dL
HGB URINE DIPSTICK: NEGATIVE
KETONES UR: 15 mg/dL — AB
Nitrite: POSITIVE — AB
Protein, ur: 30 mg/dL — AB
SPECIFIC GRAVITY, URINE: 1.02 (ref 1.005–1.030)
UROBILINOGEN UA: 4 mg/dL — AB (ref 0.0–1.0)
pH: 5 (ref 5.0–8.0)

## 2013-11-13 LAB — URINE MICROSCOPIC-ADD ON

## 2013-11-13 LAB — CBG MONITORING, ED: GLUCOSE-CAPILLARY: 128 mg/dL — AB (ref 70–99)

## 2013-11-13 LAB — POC URINE PREG, ED: Preg Test, Ur: NEGATIVE

## 2013-11-13 MED ORDER — CEFTRIAXONE SODIUM 1 G IJ SOLR
1.0000 g | Freq: Once | INTRAMUSCULAR | Status: AC
  Administered 2013-11-13: 1 g via INTRAMUSCULAR
  Filled 2013-11-13: qty 10

## 2013-11-13 MED ORDER — PHENAZOPYRIDINE HCL 95 MG PO TABS: 95.0000 mg | ORAL_TABLET | Freq: Three times a day (TID) | ORAL | Status: DC | PRN

## 2013-11-13 MED ORDER — OXYCODONE-ACETAMINOPHEN 5-325 MG PO TABS
1.0000 | ORAL_TABLET | Freq: Once | ORAL | Status: AC
  Administered 2013-11-13: 1 via ORAL
  Filled 2013-11-13: qty 1

## 2013-11-13 MED ORDER — SULFAMETHOXAZOLE-TRIMETHOPRIM 800-160 MG PO TABS: 1.0000 | ORAL_TABLET | Freq: Two times a day (BID) | ORAL | Status: DC

## 2013-11-13 MED ORDER — OXYCODONE-ACETAMINOPHEN 5-325 MG PO TABS: 1.0000 | ORAL_TABLET | Freq: Four times a day (QID) | ORAL | Status: DC | PRN

## 2013-11-13 NOTE — Discharge Instructions (Signed)
Take the antibiotic as directed until, completed.  Please call your Dr. Clent RidgesFry on Monday to set an appointment to be seen.  The following week to have a urine test of cure.  U. been given a prescription for Percocet for additional pain control.  The next one to 2 days, as well as Pyridium.  Please take these as directed thereafter, you can use Tylenol or ibuprofen alternating basis for pain control, if you're still having discomfort

## 2013-11-13 NOTE — ED Notes (Signed)
Pt c/o lower abd pain onset yesterday, +nausea.

## 2013-11-13 NOTE — ED Provider Notes (Signed)
CSN: [id number]     Arrival date & time 11/13/13  0050 History   First MD Initiated Contact with Patient 11/13/13 0114     Chief Complaint  Patient presents with  . Abdominal Pain     (Consider location/radiation/quality/duration/timing/severity/associated sxs/prior Treatment) Patient is a 38 y.o. female presenting with abdominal pain. The history is provided by the patient.  Abdominal Pain Pain location:  Suprapubic Pain quality: fullness and pressure   Pain radiates to:  Does not radiate Pain severity:  Moderate Onset quality:  Gradual Duration:  2 days Timing:  Constant Progression:  Worsening Chronicity:  New Context: not diet changes and not eating   Relieved by:  Nothing Worsened by:  Urination Ineffective treatments: pyridium. Associated symptoms: dysuria, hematuria and nausea   Associated symptoms: no constipation, no diarrhea, no fever and no shortness of breath   Dysuria:    Severity:  Moderate   Onset quality:  Gradual   Duration:  2 days   Timing:  Constant   Progression:  Worsening   Chronicity:  New Nausea:    Severity:  Mild   Onset quality:  Gradual   Duration:  1 day   Timing:  Intermittent Risk factors: obesity     Past Medical History  Diagnosis Date  . Blood in stool   . Headache(784.0)   . Hypertension   . ADHD (attention deficit hyperactivity disorder)   . Narcotic abuse   . Anxiety   . Depression   . MRSA (methicillin resistant staph aureus) culture positive     at C section site 05-2009 resolved   Past Surgical History  Procedure Laterality Date  . Tubal ligation    . Cesarean section    . Tympanostomy tube placement      rt ear 2009 dr shoemaker  . Tympanoplasty  Sept. 2012 and May 2013    right ear twice, [facility] Ruel [facility]av[facility] at [facility]  . Knee arthroscopy  2012    left knee, per Dr. Thurston Hole    Family History  Problem Relation Age of Onset  . Hypertension    . Diabetes    . Alcohol abuse    . Drug abuse      cocaine  addiction    History  Substance Use Topics  . Smoking status: Never Smoker   . Smokeless tobacco: Never Used  . Alcohol Use: No   OB History   Grav Para Term Preterm Abortions TAB SAB Ect Mult Living                 Review of Systems  Constitutional: Negative for fever.  Respiratory: Negative for shortness of breath.   Gastrointestinal: Positive for nausea and abdominal pain. Negative for diarrhea and constipation.  Genitourinary: Positive for dysuria and hematuria. Negative for frequency and vaginal pain.  Neurological: Positive for headaches.  All other systems reviewed and are negative.      Allergies  Review of patient's allergies indicates no known allergies.  Home Medications   Current Outpatient Rx  Name  Route  Sig  Dispense  Refill  . acetaminophen (TYLENOL) 500 MG tablet   Oral   Take 500 mg by mouth every 6 (six) hours as needed. For pain         . amphetamine-dextroamphetamine (ADDERALL) 30 MG tablet   Oral   Take 1 tablet (30 mg total) by mouth 2 (two) times daily.   60 tablet   0     May fill on 11-10-13   .  escitalopram (LEXAPRO) 20 MG tablet   Oral   Take 20 mg by mouth daily.         Marland Kitchen lisinopril (PRINIVIL,ZESTRIL) 20 MG tablet   Oral   Take 1 tablet (20 mg total) by mouth daily.   90 tablet   3   . mupirocin ointment (BACTROBAN) 2 %      APPLY 3 TIMES A DAY   22 g   1   . mupirocin ointment (BACTROBAN) 2 %   Nasal   Place 1 application into the nose 3 (three) times daily as needed (skin irritation).         Marland Kitchen oxyCODONE-acetaminophen (PERCOCET/ROXICET) 5-325 MG per tablet   Oral   Take 1 tablet by mouth every 6 (six) hours as needed for severe pain.   8 tablet   0   . phenazopyridine (PYRIDIUM) 95 MG tablet   Oral   Take 1 tablet (95 mg total) by mouth 3 (three) times daily as needed for pain.   10 tablet   0   . sulfamethoxazole-trimethoprim (SEPTRA DS) 800-160 MG per tablet   Oral   Take 1 tablet by mouth every 12  (twelve) hours.   10 tablet   0    BP 130/77  Pulse 129  Temp(Src) 98.4 F (36.9 C) (Oral)  Resp 18  SpO2 99%  LMP 11/06/2013 Physical Exam  Nursing note and vitals reviewed. Constitutional: She appears well-nourished.  HENT:  Head: Normocephalic.  Eyes: Pupils are equal, round, and reactive to light.  Neck: Normal range of motion.  Cardiovascular: Regular rhythm.  Tachycardia present.   Pulmonary/Chest: Effort normal. No respiratory distress.  Abdominal: Soft. She exhibits no distension. There is tenderness in the suprapubic area.  Musculoskeletal: Normal range of motion.  Neurological: She is alert.    ED Course  Procedures (including critical care time) Labs Review Labs Reviewed  URINALYSIS, ROUTINE W REFLEX MICROSCOPIC - Abnormal; Notable for the following:    Color, Urine ORANGE (*)    APPearance CLOUDY (*)    Bilirubin Urine SMALL (*)    Ketones, ur 15 (*)    Protein, ur 30 (*)    Urobilinogen, UA 4.0 (*)    Nitrite POSITIVE (*)    Leukocytes, UA LARGE (*)    All other components within normal limits  URINE MICROSCOPIC-ADD ON - Abnormal; Notable for the following:    Bacteria, UA MANY (*)    All other components within normal limits  CBG MONITORING, ED - Abnormal; Notable for the following:    Glucose-Capillary 128 (*)    All other components within normal limits  POC URINE PREG, ED   Imaging Review No results found.   EKG Interpretation None      MDM  Patient's urine, has been reviewed.  She does have a urinary tract infection.  She has been given an IM injection of Rocephin and started on Septra and Pyridium.  She was having significant, pain in the emergency department.  She was given 2 Percocet, and a prescription for several tablets that she can use at, home.  She's encouraged to make an appointment with Dr. Clent Ridges for the 8-10 day period for test of cure at any time.  She develops worsening symptoms.  She has been instructed to come back for further  evaluation or follow up with Dr. Clent Ridges Final diagnoses:  UTI (lower urinary tract infection)        Arman Filter, NP 11/13/13 0405  Cipriano Mile  Manus RuddSchulz, NP 11/13/13 0406  Arman FilterGail K Cecillia Menees, NP 11/13/13 [id number]

## 2013-11-14 NOTE — ED Provider Notes (Signed)
Medical screening examination/treatment/procedure(s) were performed by non-physician practitioner and as supervising physician I was immediately available for consultation/collaboration.   EKG Interpretation None         Loren Raceravid Andilynn Delavega, MD 11/14/13 2320

## 2013-12-10 ENCOUNTER — Telehealth: Payer: Self-pay | Admitting: Family Medicine

## 2013-12-10 MED ORDER — AMPHETAMINE-DEXTROAMPHETAMINE 30 MG PO TABS: 30.0000 mg | ORAL_TABLET | Freq: Two times a day (BID) | ORAL | Status: DC

## 2013-12-10 NOTE — Telephone Encounter (Signed)
Pt req rx amphetamine-dextroamphetamine (ADDERALL) 30 MG tablet

## 2013-12-10 NOTE — Telephone Encounter (Signed)
done

## 2013-12-10 NOTE — Telephone Encounter (Signed)
Script is ready for pick up and I left a voice message.  

## 2014-03-12 ENCOUNTER — Encounter (HOSPITAL_COMMUNITY): Payer: Self-pay | Admitting: Emergency Medicine

## 2014-03-12 ENCOUNTER — Emergency Department (HOSPITAL_COMMUNITY)
Admission: EM | Admit: 2014-03-12 | Discharge: 2014-03-12 | Disposition: A | Discharge status: Home / Self Care | Payer: Medicaid Other | Attending: Emergency Medicine | Admitting: Emergency Medicine

## 2014-03-12 DIAGNOSIS — I10 Essential (primary) hypertension: Secondary | ICD-10-CM | POA: Insufficient documentation

## 2014-03-12 DIAGNOSIS — F909 Attention-deficit hyperactivity disorder, unspecified type: Secondary | ICD-10-CM | POA: Insufficient documentation

## 2014-03-12 DIAGNOSIS — H6120 Impacted cerumen, unspecified ear: Secondary | ICD-10-CM | POA: Insufficient documentation

## 2014-03-12 DIAGNOSIS — F329 Major depressive disorder, single episode, unspecified: Secondary | ICD-10-CM | POA: Insufficient documentation

## 2014-03-12 DIAGNOSIS — Z8719 Personal history of other diseases of the digestive system: Secondary | ICD-10-CM | POA: Insufficient documentation

## 2014-03-12 DIAGNOSIS — K0889 Other specified disorders of teeth and supporting structures: Secondary | ICD-10-CM

## 2014-03-12 DIAGNOSIS — F411 Generalized anxiety disorder: Secondary | ICD-10-CM | POA: Insufficient documentation

## 2014-03-12 DIAGNOSIS — K089 Disorder of teeth and supporting structures, unspecified: Secondary | ICD-10-CM | POA: Insufficient documentation

## 2014-03-12 DIAGNOSIS — L03211 Cellulitis of face: Secondary | ICD-10-CM

## 2014-03-12 DIAGNOSIS — F3289 Other specified depressive episodes: Secondary | ICD-10-CM | POA: Insufficient documentation

## 2014-03-12 DIAGNOSIS — Z79899 Other long term (current) drug therapy: Secondary | ICD-10-CM | POA: Insufficient documentation

## 2014-03-12 DIAGNOSIS — L0201 Cutaneous abscess of face: Secondary | ICD-10-CM | POA: Insufficient documentation

## 2014-03-12 DIAGNOSIS — Z8614 Personal history of Methicillin resistant Staphylococcus aureus infection: Secondary | ICD-10-CM | POA: Insufficient documentation

## 2014-03-12 DIAGNOSIS — R221 Localized swelling, mass and lump, neck: Secondary | ICD-10-CM

## 2014-03-12 DIAGNOSIS — R220 Localized swelling, mass and lump, head: Secondary | ICD-10-CM | POA: Insufficient documentation

## 2014-03-12 MED ORDER — CEPHALEXIN 500 MG PO CAPS: 500.0000 mg | ORAL_CAPSULE | Freq: Four times a day (QID) | ORAL | Status: DC

## 2014-03-12 MED ORDER — MUPIROCIN CALCIUM 2 % EX CREA: 1.0000 "application " | TOPICAL_CREAM | Freq: Two times a day (BID) | CUTANEOUS | Status: DC

## 2014-03-12 MED ORDER — SULFAMETHOXAZOLE-TRIMETHOPRIM 800-160 MG PO TABS: 1.0000 | ORAL_TABLET | Freq: Two times a day (BID) | ORAL | Status: AC

## 2014-03-12 MED ORDER — TRAMADOL HCL 50 MG PO TABS
50.0000 mg | ORAL_TABLET | Freq: Four times a day (QID) | ORAL | Status: DC | PRN
Start: 2014-03-12 — End: 2017-10-07

## 2014-03-12 NOTE — ED Provider Notes (Signed)
CSN: [id number]     Arrival date & time 03/12/14  2019 History  This chart was scribed for non-physician practitioner working with Ethelda Chick, MD by Elveria Rising, ED Scribe. This patient was seen in room WTR7/WTR7 and the patient's care was started at 10:14 PM.   Chief Complaint  Patient presents with  . Facial Swelling     The history is provided by the patient. No language interpreter was used.   HPI Comments: Brittney Manning is a 38 y.o. female who presents to the Emergency Department complaining of an open facial wounds near her left ear, onset five days ago. Patient likens the pain initially to an ear infection that has progressively worsened. Patient reports treatment with Bactine, Neosporin and bandaids, without improvement. Patient admits to scratching/irritating the wound. Patient reports history of frequent ear infections. Patient also complaining of left upper molar dental pain for the past several months. Denies any swelling within the mouth. Patient has history of HTN.     Past Medical History  Diagnosis Date  . Blood in stool   . Headache(784.0)   . Hypertension   . ADHD (attention deficit hyperactivity disorder)   . Narcotic abuse   . Anxiety   . Depression   . MRSA (methicillin resistant staph aureus) culture positive     at C section site 05-2009 resolved   Past Surgical History  Procedure Laterality Date  . Tubal ligation    . Cesarean section    . Tympanostomy tube placement      rt ear 2009 dr shoemaker  . Tympanoplasty  Sept. 2012 and May 2013    right ear twice, per Dr. Ruel Favors at [facility]  . Knee arthroscopy  2012    left knee, per Dr. Thurston Hole    Family History  Problem Relation Age of Onset  . Hypertension    . Diabetes    . Alcohol abuse    . Drug abuse      cocaine addiction    History  Substance Use Topics  . Smoking status: Never Smoker   . Smokeless tobacco: Never Used  . Alcohol Use: Yes     Comment: occ   OB History    Grav Para Term Preterm Abortions TAB SAB Ect Mult Living                 Review of Systems  Constitutional: Negative for fever, chills and diaphoresis.  HENT: Positive for dental problem and facial swelling.   Skin: Positive for wound.  All other systems reviewed and are negative.     Allergies  Review of patient's allergies indicates no known allergies.  Home Medications   Prior to Admission medications   Medication Sig Start Date End Date Taking? Authorizing Provider  amphetamine-dextroamphetamine (ADDERALL) 30 MG tablet Take 1 tablet (30 mg total) by mouth 2 (two) times daily. 12/10/13  Yes Nelwyn Salisbury, MD  escitalopram (LEXAPRO) 20 MG tablet Take 20 mg by mouth daily.   Yes Historical Provider, MD  lisinopril (PRINIVIL,ZESTRIL) 20 MG tablet Take 1 tablet (20 mg total) by mouth daily. 08/20/13  Yes Nelwyn Salisbury, MD   Triage Vitals: BP 133/81  Pulse 107  Temp(Src) 98.1 F (36.7 C) (Oral)  Resp 16  Ht 5\' 6"  (1.676 m)  Wt 256 lb (116.121 kg)  BMI 41.34 kg/m2  SpO2 100%  LMP 02/09/2014 Physical Exam  Nursing note and vitals reviewed. Constitutional: She is oriented to person, place, and time. She  appears well-developed and well-nourished. No distress.  HENT:  Head: Normocephalic and atraumatic.  There is decay of the left upper first molar to the gum line. Tenderness to percussion over the area. There is no significant swelling of the gums, erythema or friability. No signs of dental abscess.  Bilateral cerumen impaction of the ears.  Eyes: Conjunctivae and EOM are normal.  Neck: Neck supple.  Cardiovascular: Normal rate.   Pulmonary/Chest: Effort normal. No respiratory distress.  Musculoskeletal: Normal range of motion.  Neurological: She is alert and oriented to person, place, and time.  Skin: Skin is warm and dry.  Small 1/2 cm oval area of ulceration and erythema to the skin near the left mouth there is a similar lesion to the left tragus of the ear with mild  swelling and tenderness. No fluctuance.  Psychiatric: She has a normal mood and affect. Her behavior is normal.    ED Course  Procedures   COORDINATION OF CARE: 10:27 PM- Discussed treatment plan with patient at bedside and patient agreed to plan.    Patient with 2 areas of mild swelling erythema induration to the left face. One near the tragus of the ear the other near the mouth. There is no nodularity, fluctuance or signs of abscess. Patient also complaining of left upper molar pain. She has a decayed molar tooth the gum line. There is no significant swelling of the adjacent gums or signs of dental abscess. At this time discussed plan for antibiotics for a possible small cellulitis of the face as well as to cover her dental pain. Patient will be given referral for oral surgery followup.    MDM   Final diagnoses:  Cellulitis of face  Pain, dental      I personally performed the services described in this documentation, which was scribed in my presence. The recorded information has been reviewed and is accurate.    Angus Sellereter S Somtochukwu Woollard, PA-C 03/12/14 2332

## 2014-03-12 NOTE — ED Notes (Signed)
Pt presents with L sided facial swelling with open draining wound to cheek and near ear. Pt also c/o L upper dental pain

## 2014-03-12 NOTE — ED Provider Notes (Signed)
Medical screening examination/treatment/procedure(s) were performed by non-physician practitioner and as supervising physician I was immediately available for consultation/collaboration.   EKG Interpretation None       Ethelda ChickMartha K Linker, MD 03/12/14 [id number]

## 2014-03-12 NOTE — Discharge Instructions (Signed)
Keep your skin clean and dry. Have a recheck in 2 days to be sure symptoms are improving. Followup with a dentist or oral surgeon on Monday for continued evaluation of your tooth pain.    Cellulitis Cellulitis is an infection of the skin and the tissue under the skin. The infected area is usually red and tender. This happens most often in the arms and lower legs. HOME CARE   Take your antibiotic medicine as told. Finish the medicine even if you start to feel better.  Keep the infected arm or leg raised (elevated).  Put a warm cloth on the area up to 4 times per day.  Only take medicines as told by your doctor.  Keep all doctor visits as told. GET HELP IF:  You see red streaks on the skin coming from the infected area.  Your red area gets bigger or turns a dark color.  Your bone or joint under the infected area is painful after the skin heals.  Your infection comes back in the same area or different area.  You have a puffy (swollen) bump in the infected area.  You have new symptoms.  You have a fever. GET HELP RIGHT AWAY IF:   You feel very sleepy.  You throw up (vomit) or have watery poop (diarrhea).  You feel sick and have muscle aches and pains. MAKE SURE YOU:   Understand these instructions.  Will watch your condition.  Will get help right away if you are not doing well or get worse. Document Released: 01/15/2008 Document Revised: 12/13/2013 Document Reviewed: 10/14/2011 [facility] Patient Information 2015 [place], [place]. This information is not intended to replace advice given to you by your health care provider. Make sure you discuss any questions you have with your health care provider.    Dental Pain Toothache is pain in or around a tooth. It may get worse with chewing or with cold or heat.  HOME CARE  Your dentist may use a numbing medicine during treatment. If so, you may need to avoid eating until the medicine wears off. Ask your dentist about  this.  Only take medicine as told by your dentist or doctor.  Avoid chewing food near the painful tooth until after all treatment is done. Ask your dentist about this. GET HELP RIGHT AWAY IF:   The problem gets worse or new problems appear.  You have a fever.  There is redness and puffiness (swelling) of the face, jaw, or neck.  You cannot open your mouth.  There is pain in the jaw.  There is very bad pain that is not helped by medicine. MAKE SURE YOU:   Understand these instructions.  Will watch your condition.  Will get help right away if you are not doing well or get worse. Document Released: 01/15/2008 Document Revised: 10/21/2011 Document Reviewed: 01/15/2008 [facility] Patient Information 2015 [place], [place]. This information is not intended to replace advice given to you by your health care provider. Make sure you discuss any questions you have with your health care provider.

## 2014-03-12 NOTE — ED Notes (Signed)
Patient is alert and oriented x3.  She was given DC instructions and follow up visit instructions.  Patient gave verbal understanding. She was DC ambulatory under her own power to home.  V/S stable.  He was not showing any signs of distress on DC 

## 2014-03-14 ENCOUNTER — Telehealth: Payer: Self-pay | Admitting: Family Medicine

## 2014-03-14 NOTE — Telephone Encounter (Signed)
Pt needs new rx generic adderall 30 mg #60 °

## 2014-03-15 MED ORDER — AMPHETAMINE-DEXTROAMPHETAMINE 30 MG PO TABS: 30.0000 mg | ORAL_TABLET | Freq: Two times a day (BID) | ORAL | Status: DC

## 2014-03-15 NOTE — Telephone Encounter (Signed)
Script is ready for pick up and I left a voice message for pt. 

## 2014-03-15 NOTE — Telephone Encounter (Signed)
done

## 2014-06-24 ENCOUNTER — Telehealth: Payer: Self-pay | Admitting: Family Medicine

## 2014-06-24 NOTE — Telephone Encounter (Signed)
Pt needs new rx generic adderall 30 mg °

## 2014-06-27 MED ORDER — AMPHETAMINE-DEXTROAMPHETAMINE 30 MG PO TABS: 30.0000 mg | ORAL_TABLET | Freq: Two times a day (BID) | ORAL | Status: AC

## 2014-06-27 NOTE — Telephone Encounter (Signed)
Done for one month only. She needs an OV after that

## 2014-06-27 NOTE — Telephone Encounter (Signed)
Script is ready for pick up and I spoke with pt.  

## 2014-07-05 ENCOUNTER — Other Ambulatory Visit: Payer: Self-pay | Admitting: Family Medicine

## 2016-06-16 ENCOUNTER — Other Ambulatory Visit: Payer: Self-pay | Admitting: Family Medicine

## 2016-06-17 NOTE — Telephone Encounter (Signed)
She will need an OV for this  

## 2016-11-26 ENCOUNTER — Emergency Department (HOSPITAL_COMMUNITY)
Admission: EM | Admit: 2016-11-26 | Discharge: 2016-11-26 | Disposition: A | Discharge status: Home / Self Care | Payer: Medicaid Other | Attending: Emergency Medicine | Admitting: Emergency Medicine

## 2016-11-26 ENCOUNTER — Encounter (HOSPITAL_COMMUNITY): Payer: Self-pay | Admitting: Emergency Medicine

## 2016-11-26 DIAGNOSIS — F909 Attention-deficit hyperactivity disorder, unspecified type: Secondary | ICD-10-CM | POA: Insufficient documentation

## 2016-11-26 DIAGNOSIS — L03211 Cellulitis of face: Secondary | ICD-10-CM | POA: Insufficient documentation

## 2016-11-26 DIAGNOSIS — R220 Localized swelling, mass and lump, head: Secondary | ICD-10-CM | POA: Diagnosis present

## 2016-11-26 DIAGNOSIS — I10 Essential (primary) hypertension: Secondary | ICD-10-CM | POA: Diagnosis not present

## 2016-11-26 MED ORDER — CEPHALEXIN 500 MG PO CAPS: 500.0000 mg | ORAL_CAPSULE | Freq: Three times a day (TID) | ORAL | 0 refills | Status: AC

## 2016-11-26 NOTE — Discharge Instructions (Signed)
You have been seen today in the Emergency Department (ED) for cellulitis, a superficial skin infection. Please take your antibiotics as prescribed for their ENTIRE prescribed duration.  Take Tylenol or Motrin as needed for pain, but only as written on the box.   Please follow up with your doctor in 24-48 hours for recheck of your infection if you are not improving.  Call your doctor sooner or return to the ED if you develop worsening signs of infection such as: increased redness, increased pain, pus, fever, or other symptoms that concern you.

## 2016-11-26 NOTE — ED Triage Notes (Signed)
Pt reports "bumps" to face onset today. She's wearing makeup in triage so difficult to see - look like large blemishes. Reports sore throat and "glands swollen."

## 2016-11-26 NOTE — ED Provider Notes (Signed)
Emergency Department Provider Note   I have reviewed the triage vital signs and the nursing notes.   HISTORY  Chief Complaint Sore Throat and Facial Swelling (small "bumps" on L cheek)   HPI Brittney Manning is a 41 y.o. female with PMH of HTN and MRSA and see emergency room in for evaluation of painful rash over her face but is worsened over the past 2 days. She states it began as a swollen, pimple area on her cheek. She picked the area but it did not drain significantly. She reports some worsening redness and pain in that area and today it spread over other parts of the the face. No radiation of symptoms. No modifying factors.   Past Medical History:  Diagnosis Date  . ADHD (attention deficit hyperactivity disorder)   . Anxiety   . Blood in stool   . Depression   . Headache(784.0)   . Hypertension   . MRSA (methicillin resistant staph aureus) culture positive    at C section site 05-2009 resolved  . Narcotic abuse     Patient Active Problem List   Diagnosis Date Noted  . CERUMEN IMPACTION 08/25/2009  . EAR PAIN 08/25/2009  . APHTHOUS ULCERS 08/25/2009  . ANXIETY 09/28/2008  . NARCOTIC ABUSE 08/31/2008  . DEPRESSION 08/31/2008  . ACUTE CYSTITIS 08/31/2008  . ADHD 03/22/2008  . HYPERTENSION 03/22/2008  . VIRAL INFECTION 05/12/2007  . HEADACHE 03/27/2007    Past Surgical History:  Procedure Laterality Date  . CESAREAN SECTION    . KNEE ARTHROSCOPY  2012   left knee, per Dr. Thurston Hole   . TUBAL LIGATION    . TYMPANOPLASTY  Sept. 2012 and May 2013   right ear twice, per Dr. Ruel Favors at [facility]  . TYMPANOSTOMY TUBE PLACEMENT     rt ear 2009 dr shoemaker    Current Outpatient Rx  . Order #: [id number] Class: Print  . Order #: [id number] Class: Print  . Order #: [id number] Class: Historical Med  . Order #: [id number] Class: Normal  . Order #: [id number] Class: Normal  . Order #: [id number] Class: Print  . Order #: [id number] Class: Print    Allergies Patient has  no known allergies.  Family History  Problem Relation Age of Onset  . Hypertension    . Diabetes    . Alcohol abuse    . Drug abuse      cocaine addiction     Social History Social History  Substance Use Topics  . Smoking status: Never Smoker  . Smokeless tobacco: Never Used  . Alcohol use Yes     Comment: occ    Review of Systems  Constitutional: No fever/chills Eyes: No visual changes. ENT: No sore throat. Cardiovascular: Denies chest pain. Respiratory: Denies shortness of breath. Gastrointestinal: No abdominal pain.  No nausea, no vomiting.  No diarrhea.  No constipation. Genitourinary: Negative for dysuria. Musculoskeletal: Negative for back pain. Skin: Positive, painful face rash.  Neurological: Negative for headaches, focal weakness or numbness.  10-point ROS otherwise negative.  ____________________________________________   PHYSICAL EXAM:  VITAL SIGNS: ED Triage Vitals  Enc Vitals Group     BP 11/26/16 1929 130/68     Pulse Rate 11/26/16 1929 84     Resp 11/26/16 1929 18     Temp 11/26/16 1929 97.8 F (36.6 C)     Temp Source 11/26/16 1929 Oral     SpO2 11/26/16 1929 100 %     Weight --      Height 11/26/16 1929 5'  5" (1.651 m)     Pain Score 11/26/16 1927 4   Constitutional: Alert and oriented. Well appearing and in no acute distress. Eyes: Conjunctivae are normal. Head: Atraumatic. Nose: No congestion/rhinnorhea. Mouth/Throat: Mucous membranes are moist.  Oropharynx non-erythematous. Neck: No stridor.  Cardiovascular: Normal rate, regular rhythm. Good peripheral circulation. Grossly normal heart sounds.   Respiratory: Normal respiratory effort.  No retractions. Lungs CTAB. Gastrointestinal: Soft and nontender. No distention.  Musculoskeletal: No lower extremity tenderness nor edema. No gross deformities of extremities. Neurologic:  Normal speech and language. No gross focal neurologic deficits are appreciated.  Skin:  Skin is warm, dry and  intact. Several areas of erythema over the left face with right temple. No petechiae. No bullae. No active drainage.   ____________________________________________   PROCEDURES  Procedure(s) performed:   Procedures  None ____________________________________________   INITIAL IMPRESSION / ASSESSMENT AND PLAN / ED COURSE  Pertinent labs & imaging results that were available during my care of the patient were reviewed by me and considered in my medical decision making (see chart for details).  Patient presents to the ED with painful face rash. Not dermatomal distribution. Looks most consistent with face cellulitis. No abscess. No ulceration. No mucosal surface involvement to suggest SJS/TEN. Advised changing all makeup materials and start abx for the next week. Will follow with PCP in the coming week.   At this time, I do not feel there is any life-threatening condition present. I have reviewed and discussed all results (EKG, imaging, lab, urine as appropriate), exam findings with patient. I have reviewed nursing notes and appropriate previous records.  I feel the patient is safe to be discharged home without further emergent workup. Discussed usual and customary return precautions. Patient and family (if present) verbalize understanding and are comfortable with this plan.  Patient will follow-up with their primary care provider. If they do not have a primary care provider, information for follow-up has been provided to them. All questions have been answered.  ____________________________________________  FINAL CLINICAL IMPRESSION(S) / ED DIAGNOSES  Final diagnoses:  Facial cellulitis  Facial swelling     MEDICATIONS GIVEN DURING THIS VISIT:  None  NEW OUTPATIENT MEDICATIONS STARTED DURING THIS VISIT:  Keflex   Note:  This document was prepared using Dragon voice recognition software and may include unintentional dictation errors.  Alona Bene, MD Emergency Medicine     Maia Plan, MD 11/27/16 [phone]

## 2016-11-26 NOTE — ED Triage Notes (Signed)
Just started synthroid 

## 2017-02-20 ENCOUNTER — Encounter (HOSPITAL_COMMUNITY): Payer: Self-pay | Admitting: Emergency Medicine

## 2017-02-20 ENCOUNTER — Emergency Department (HOSPITAL_COMMUNITY): Payer: Medicaid Other

## 2017-02-20 DIAGNOSIS — M25571 Pain in right ankle and joints of right foot: Secondary | ICD-10-CM | POA: Insufficient documentation

## 2017-02-20 DIAGNOSIS — Z5321 Procedure and treatment not carried out due to patient leaving prior to being seen by health care provider: Secondary | ICD-10-CM | POA: Insufficient documentation

## 2017-02-20 NOTE — ED Triage Notes (Signed)
Pt c/o right ankle pain x one week. The is small wound to the right ankle.

## 2017-02-21 ENCOUNTER — Emergency Department (HOSPITAL_COMMUNITY)
Admission: EM | Admit: 2017-02-21 | Discharge: 2017-02-21 | Disposition: A | Discharge status: Home / Self Care | Payer: Medicaid Other | Attending: Emergency Medicine | Admitting: Emergency Medicine

## 2017-02-21 NOTE — ED Notes (Signed)
Called for patient in all waiting areas without an answer

## 2017-05-05 ENCOUNTER — Emergency Department (HOSPITAL_COMMUNITY)
Admission: EM | Admit: 2017-05-05 | Discharge: 2017-05-05 | Discharge status: Against Medical Advice | Payer: Medicaid Other

## 2017-05-05 NOTE — ED Notes (Signed)
Called pt for triage did not answer 

## 2017-05-05 NOTE — ED Notes (Signed)
Called no answer

## 2017-09-19 ENCOUNTER — Ambulatory Visit: Payer: Self-pay | Admitting: Surgery

## 2017-09-19 NOTE — H&P (View-Only) (Signed)
istory of Present Illness (Brittney Manning K. Rocky Gladden MD; 09/19/2017 11:07 AM) The patient is a 41 year old female who presents for evaluation of gall stones. Referred by Dr. Shaheed at [facility] for symptomatic gallstones  This is a 41-year-old female who presents with at least a 5 year history of intermittent right-sided abdominal pain. Over the last 3 months this has become more frequent and much more severe. This test happen after eating meals in the evening. Fried foods seem to be worse. She does not have a nausea or diarrhea but she does have significant abdominal bloating and flatulence. She had gallstones documented on ultrasound previously. Her studies have not been repeated. She does report a couple of episodes of very dark urine and a yellowish tinge to her skin. She is not jaundiced today.  She had some black tarry stools. She was evaluated by GI at [facility] and underwent an EGD. This showed a gastric antral ulcer secondary to NSAID use. She is now on a PPI.   Past Surgical History (Danielle Gerrigner, CMA; 09/19/2017 10:06 AM) Anal Fissure Repair Cesarean Section - 1 Knee Surgery Left. Oral Surgery  Diagnostic Studies History (Danielle Gerrigner, CMA; 09/19/2017 10:06 AM) Colonoscopy 5-10 years ago Mammogram never Pap Smear 1-5 years ago  Allergies (Danielle Gerrigner, CMA; 09/19/2017 10:07 AM) No Known Allergies [09/19/2017]: Allergies Reconciled  Medication History (Danielle Gerrigner, CMA; 09/19/2017 10:08 AM) Adderall (30MG Tablet, Oral) Active. Lexapro (20MG Tablet, Oral) Active. Lisinopril (20MG Tablet, Oral) Active. TraMADol HCl ER (Oral) Specific strength unknown - Active. Medications Reconciled  Social History (Danielle Gerrigner, CMA; 09/19/2017 10:06 AM) Alcohol use Occasional alcohol use. Caffeine use Carbonated beverages, Coffee. No drug use Tobacco use Never smoker.  Family History (Danielle Gerrigner, CMA; 09/19/2017  10:06 AM) Alcohol Abuse Mother, Son. Arthritis Father, Mother. Breast Cancer Mother. Cerebrovascular Accident Father. Cervical Cancer Mother. Diabetes Mellitus Father. Heart Disease Father. Hypertension Father.  Pregnancy / Birth History (Danielle Gerrigner, CMA; 09/19/2017 10:06 AM) Age at menarche 13 years. Gravida 4 Maternal age 15-20 Para 3 Regular periods  Other Problems (Danielle Gerrigner, CMA; 09/19/2017 10:06 AM) Anxiety Disorder Arthritis Back Pain Depression Gastric Ulcer Hemorrhoids High blood pressure Migraine Headache     Review of Systems (Danielle Gerrigner CMA; 09/19/2017 10:06 AM) General Present- Fatigue. Not Present- Appetite Loss, Chills, Fever, Night Sweats, Weight Gain and Weight Loss. Skin Not Present- Change in Wart/Mole, Dryness, Hives, Jaundice, New Lesions, Non-Healing Wounds, Rash and Ulcer. HEENT Not Present- Earache, Hearing Loss, Hoarseness, Nose Bleed, Oral Ulcers, Ringing in the Ears, Seasonal Allergies, Sinus Pain, Sore Throat, Visual Disturbances, Wears glasses/contact lenses and Yellow Eyes. Respiratory Not Present- Bloody sputum, Chronic Cough, Difficulty Breathing, Snoring and Wheezing. Breast Not Present- Breast Mass, Breast Pain, Nipple Discharge and Skin Changes. Cardiovascular Not Present- Chest Pain, Difficulty Breathing Lying Down, Leg Cramps, Palpitations, Rapid Heart Rate, Shortness of Breath and Swelling of Extremities. Gastrointestinal Present- Abdominal Pain, Bloating, Excessive gas and Indigestion. Not Present- Bloody Stool, Change in Bowel Habits, Chronic diarrhea, Constipation, Difficulty Swallowing, Gets full quickly at meals, Hemorrhoids, Nausea, Rectal Pain and Vomiting. Female Genitourinary Not Present- Frequency, Nocturia, Painful Urination, Pelvic Pain and Urgency. Musculoskeletal Present- Back Pain and Joint Pain. Not Present- Joint Stiffness, Muscle Pain, Muscle Weakness and Swelling of  Extremities. Neurological Not Present- Decreased Memory, Fainting, Headaches, Numbness, Seizures, Tingling, Tremor, Trouble walking and Weakness. Psychiatric Not Present- Anxiety, Bipolar, Change in Sleep Pattern, Depression, Fearful and Frequent crying. Endocrine Not Present- Cold Intolerance, Excessive Hunger, Hair Changes, Heat   Intolerance, Hot flashes and New Diabetes. Hematology Not Present- Blood Thinners, Easy Bruising, Excessive bleeding, Gland problems, HIV and Persistent Infections.  Vitals (Danielle Gerrigner CMA; 09/19/2017 10:08 AM) 09/19/2017 10:08 AM Weight: 256 lb Height: 65in Body Surface Area: 2.2 m Body Mass Index: 42.6 kg/m  Temp.: 98.5F(Oral)  Pulse: 105 (Regular)  BP: 100/60 (Sitting, Right Arm, Large)      Physical Exam (Mirissa Lopresti K. Merced Brougham MD; 09/19/2017 11:07 AM)  The physical exam findings are as follows: Note:WDWN in NAD Eyes: Pupils equal, round; sclera anicteric HENT: Oral mucosa moist; good dentition Neck: No masses palpated, no thyromegaly Lungs: CTA bilaterally; normal respiratory effort CV: Regular rate and rhythm; no murmurs; extremities well-perfused with no edema Abd: +bowel sounds, soft, mildly tender in RUQ; no palpable masses Skin: Warm, dry; no sign of jaundice Psychiatric - alert and oriented x 4; calm mood and affect    Assessment & Plan (Rachelle Edwards K. Natassja Ollis MD; 09/19/2017 10:34 AM)  CHRONIC CHOLECYSTITIS WITH CALCULUS (K80.10)  Current Plans Schedule for Surgery - Laparoscopic cholecystectomy with intraoperative cholangiogram. The surgical procedure has been discussed with the patient. Potential risks, benefits, alternative treatments, and expected outcomes have been explained. All of the patient's questions at this time have been answered. The likelihood of reaching the patient's treatment goal is good. The patient understand the proposed surgical procedure and wishes to proceed.  Ellora Varnum K. Quentavious Rittenhouse, MD, FACS [facility] [place]  [facility]  General/ Trauma Surgery  09/19/2017 11:08 AM   

## 2017-09-19 NOTE — H&P (Signed)
istory of Present Illness Wilmon Arms. Shanyiah Conde MD; 09/19/2017 11:07 AM) The patient is a 42 year old female who presents for evaluation of gall stones. Referred by Dr. Elam Dutch at [facility] for symptomatic gallstones  This is a 42 year old female who presents with at least a 5 year history of intermittent right-sided abdominal pain. Over the last 3 months this has become more frequent and much more severe. This test happen after eating meals in the evening. Fried foods seem to be worse. She does not have a nausea or diarrhea but she does have significant abdominal bloating and flatulence. She had gallstones documented on ultrasound previously. Her studies have not been repeated. She does report a couple of episodes of very dark urine and a yellowish tinge to her skin. She is not jaundiced today.  She had some black tarry stools. She was evaluated by GI at [facility] and underwent an EGD. This showed a gastric antral ulcer secondary to NSAID use. She is now on a PPI.   Past Surgical History Sander Nephew, CMA; 09/19/2017 10:06 AM) Anal Fissure Repair Cesarean Section - 1 Knee Surgery Left. Oral Surgery  Diagnostic Studies History Sander Nephew, CMA; 09/19/2017 10:06 AM) Colonoscopy 5-10 years ago Mammogram never Pap Smear 1-5 years ago  Allergies Sander Nephew, CMA; 09/19/2017 10:07 AM) No Known Allergies [09/19/2017]: Allergies Reconciled  Medication History Sander Nephew, CMA; 09/19/2017 10:08 AM) Adderall (30MG  Tablet, Oral) Active. Lexapro (20MG  Tablet, Oral) Active. Lisinopril (20MG  Tablet, Oral) Active. TraMADol HCl ER (Oral) Specific strength unknown - Active. Medications Reconciled  Social History Duwayne Heck [employer], CMA; 09/19/2017 10:06 AM) Alcohol use Occasional alcohol use. Caffeine use Carbonated beverages, Coffee. No drug use Tobacco use Never smoker.  Family History Sander Nephew, CMA; 09/19/2017  10:06 AM) Alcohol Abuse Mother, Son. Arthritis Father, Mother. Breast Cancer Mother. Cerebrovascular Accident Father. Cervical Cancer Mother. Diabetes Mellitus Father. Heart Disease Father. Hypertension Father.  Pregnancy / Birth History Sander Nephew, CMA; 09/19/2017 10:06 AM) Age at menarche 13 years. Gravida 4 Maternal age 25-20 Para 3 Regular periods  Other Problems Sander Nephew, CMA; 09/19/2017 10:06 AM) Anxiety Disorder Arthritis Back Pain Depression Gastric Ulcer Hemorrhoids High blood pressure Migraine Headache     Review of Systems Duwayne Heck Gerrigner CMA; 09/19/2017 10:06 AM) General Present- Fatigue. Not Present- Appetite Loss, Chills, Fever, Night Sweats, Weight Gain and Weight Loss. Skin Not Present- Change in Wart/Mole, Dryness, Hives, Jaundice, New Lesions, Non-Healing Wounds, Rash and Ulcer. HEENT Not Present- Earache, Hearing Loss, Hoarseness, Nose Bleed, Oral Ulcers, Ringing in the Ears, Seasonal Allergies, Sinus Pain, Sore Throat, Visual Disturbances, Wears glasses/contact lenses and Yellow Eyes. Respiratory Not Present- Bloody sputum, Chronic Cough, Difficulty Breathing, Snoring and Wheezing. Breast Not Present- Breast Mass, Breast Pain, Nipple Discharge and Skin Changes. Cardiovascular Not Present- Chest Pain, Difficulty Breathing Lying Down, Leg Cramps, Palpitations, Rapid Heart Rate, Shortness of Breath and Swelling of Extremities. Gastrointestinal Present- Abdominal Pain, Bloating, Excessive gas and Indigestion. Not Present- Bloody Stool, Change in Bowel Habits, Chronic diarrhea, Constipation, Difficulty Swallowing, Gets full quickly at meals, Hemorrhoids, Nausea, Rectal Pain and Vomiting. Female Genitourinary Not Present- Frequency, Nocturia, Painful Urination, Pelvic Pain and Urgency. Musculoskeletal Present- Back Pain and Joint Pain. Not Present- Joint Stiffness, Muscle Pain, Muscle Weakness and Swelling of  Extremities. Neurological Not Present- Decreased Memory, Fainting, Headaches, Numbness, Seizures, Tingling, Tremor, Trouble walking and Weakness. Psychiatric Not Present- Anxiety, Bipolar, Change in Sleep Pattern, Depression, Fearful and Frequent crying. Endocrine Not Present- Cold Intolerance, Excessive Hunger, Hair Changes, Heat  Intolerance, Hot flashes and New Diabetes. Hematology Not Present- Blood Thinners, Easy Bruising, Excessive bleeding, Gland problems, HIV and Persistent Infections.  Vitals (Danielle Gerrigner CMA; 09/19/2017 10:08 AM) 09/19/2017 10:08 AM Weight: 256 lb Height: 65in Body Surface Area: 2.2 m Body Mass Index: 42.6 kg/m  Temp.: 98.36F(Oral)  Pulse: 105 (Regular)  BP: 100/60 (Sitting, Right Arm, Large)      Physical Exam [place](Melo Stauber K. Presleigh Feldstein MD; 09/19/2017 11:07 AM)  The physical exam findings are as follows: Note:WDWN in NAD Eyes: Pupils equal, round; sclera anicteric HENT: Oral mucosa moist; good dentition Neck: No masses palpated, no thyromegaly Lungs: CTA bilaterally; normal respiratory effort CV: Regular rate and rhythm; no murmurs; extremities well-perfused with no edema Abd: +bowel sounds, soft, mildly tender in RUQ; no palpable masses Skin: Warm, dry; no sign of jaundice Psychiatric - alert and oriented x 4; calm mood and affect    Assessment & Plan [place](Shemuel Harkleroad K. Enrique Manganaro MD; 09/19/2017 10:34 AM)  CHRONIC CHOLECYSTITIS WITH CALCULUS (K80.10)  Current Plans Schedule for Surgery - Laparoscopic cholecystectomy with intraoperative cholangiogram. The surgical procedure has been discussed with the patient. Potential risks, benefits, alternative treatments, and expected outcomes have been explained. All of the patient's questions at this time have been answered. The likelihood of reaching the patient's treatment goal is good. The patient understand the proposed surgical procedure and wishes to proceed.  Wilmon ArmsMatthew K. Corliss Skainssuei, MD, [facility] PsFACS [facility] [place]  [facility]  General/ Trauma Surgery  09/19/2017 11:08 AM

## 2017-10-02 ENCOUNTER — Encounter (HOSPITAL_COMMUNITY): Payer: Self-pay | Admitting: *Deleted

## 2017-10-02 NOTE — Progress Notes (Signed)
LVM with Steward DroneBrenda at [facility] making her aware of unsuccessful attempts to contact pt and if an alternate number was available; will continue to call pt T/O the day.

## 2017-10-03 ENCOUNTER — Encounter (HOSPITAL_COMMUNITY): Payer: Self-pay | Admitting: *Deleted

## 2017-10-03 ENCOUNTER — Other Ambulatory Visit: Payer: Self-pay

## 2017-10-03 NOTE — Progress Notes (Signed)
Pt denies SOB, chest pain, and being under the care of a cardiologist. Pt denies having a stress test, echo and cardiac cath. Pt denies recent labs. Pt denies having an EKG and chest x ray within the last year. Pt made aware to stop taking Aspirin, vitamins, fish oil and herbal medications. Do not take any NSAIDs ie: Ibuprofen, Advil, Naproxen (Aleve), Motrin, BC and Goody Powder. Pt verbalized understanding of all pre-op instructions. Steward DroneBrenda at [facility] made aware of successful pt contact.

## 2017-10-06 MED ORDER — DEXTROSE 5 % IV SOLN
3.0000 g | INTRAVENOUS | Status: AC
  Administered 2017-10-07: 3 g via INTRAVENOUS
  Filled 2017-10-06: qty 3

## 2017-10-07 ENCOUNTER — Ambulatory Visit (HOSPITAL_COMMUNITY): Payer: Medicaid Other | Admitting: Anesthesiology

## 2017-10-07 ENCOUNTER — Encounter (HOSPITAL_COMMUNITY)
Admission: RE | Disposition: A | Discharge status: Home / Self Care | Payer: Self-pay | Source: Ambulatory Visit | Attending: Surgery

## 2017-10-07 ENCOUNTER — Ambulatory Visit (HOSPITAL_COMMUNITY): Payer: Medicaid Other

## 2017-10-07 ENCOUNTER — Ambulatory Visit (HOSPITAL_COMMUNITY)
Admission: RE | Admit: 2017-10-07 | Discharge: 2017-10-07 | Disposition: A | Discharge status: Home / Self Care | Payer: Medicaid Other | Source: Ambulatory Visit | Attending: Surgery | Admitting: Surgery

## 2017-10-07 ENCOUNTER — Encounter (HOSPITAL_COMMUNITY): Payer: Self-pay | Admitting: General Practice

## 2017-10-07 DIAGNOSIS — F909 Attention-deficit hyperactivity disorder, unspecified type: Secondary | ICD-10-CM | POA: Insufficient documentation

## 2017-10-07 DIAGNOSIS — I10 Essential (primary) hypertension: Secondary | ICD-10-CM | POA: Insufficient documentation

## 2017-10-07 DIAGNOSIS — K219 Gastro-esophageal reflux disease without esophagitis: Secondary | ICD-10-CM | POA: Insufficient documentation

## 2017-10-07 DIAGNOSIS — K8020 Calculus of gallbladder without cholecystitis without obstruction: Secondary | ICD-10-CM | POA: Diagnosis present

## 2017-10-07 DIAGNOSIS — M1990 Unspecified osteoarthritis, unspecified site: Secondary | ICD-10-CM | POA: Diagnosis not present

## 2017-10-07 DIAGNOSIS — F419 Anxiety disorder, unspecified: Secondary | ICD-10-CM | POA: Diagnosis not present

## 2017-10-07 DIAGNOSIS — Z8719 Personal history of other diseases of the digestive system: Secondary | ICD-10-CM | POA: Insufficient documentation

## 2017-10-07 DIAGNOSIS — F329 Major depressive disorder, single episode, unspecified: Secondary | ICD-10-CM | POA: Diagnosis not present

## 2017-10-07 DIAGNOSIS — K8010 Calculus of gallbladder with chronic cholecystitis without obstruction: Secondary | ICD-10-CM | POA: Diagnosis not present

## 2017-10-07 DIAGNOSIS — Z419 Encounter for procedure for purposes other than remedying health state, unspecified: Secondary | ICD-10-CM

## 2017-10-07 HISTORY — DX: Pneumonia, unspecified organism: J18.9

## 2017-10-07 HISTORY — DX: Calculus of gallbladder with chronic cholecystitis without obstruction: K80.10

## 2017-10-07 HISTORY — PX: CHOLECYSTECTOMY: SHX55

## 2017-10-07 LAB — BASIC METABOLIC PANEL
Anion gap: 9 (ref 5–15)
BUN: 8 mg/dL (ref 6–20)
CO2: 25 mmol/L (ref 22–32)
Calcium: 9.3 mg/dL (ref 8.9–10.3)
Chloride: 105 mmol/L (ref 101–111)
Creatinine, Ser: 0.63 mg/dL (ref 0.44–1.00)
GFR calc non Af Amer: >60 mL/min (ref >60)
Glucose, Bld: 81 mg/dL (ref 65–99)
POTASSIUM: 3.4 mmol/L — AB (ref 3.5–5.1)
SODIUM: 139 mmol/L (ref 135–145)

## 2017-10-07 LAB — CBC
HEMATOCRIT: 38.5 % (ref 36.0–46.0)
HEMOGLOBIN: 12.8 g/dL (ref 12.0–15.0)
MCH: 31.3 pg (ref 26.0–34.0)
MCHC: 33.2 g/dL (ref 30.0–36.0)
MCV: 94.1 fL (ref 78.0–100.0)
Platelets: 231 10*3/uL (ref 150–400)
RBC: 4.09 MIL/uL (ref 3.87–5.11)
RDW: 13 % (ref 11.5–15.5)
WBC: 6.5 10*3/uL (ref 4.0–10.5)

## 2017-10-07 LAB — POCT PREGNANCY, URINE: Preg Test, Ur: NEGATIVE

## 2017-10-07 SURGERY — LAPAROSCOPIC CHOLECYSTECTOMY WITH INTRAOPERATIVE CHOLANGIOGRAM
Anesthesia: General | Site: Abdomen

## 2017-10-07 MED ORDER — SODIUM CHLORIDE 0.9 % IR SOLN
Status: DC | PRN
  Administered 2017-10-07: 1000 mL

## 2017-10-07 MED ORDER — OXYCODONE HCL 5 MG PO TABS
5.0000 mg | ORAL_TABLET | Freq: Once | ORAL | Status: AC
  Administered 2017-10-07: 5 mg via ORAL

## 2017-10-07 MED ORDER — DEXAMETHASONE SODIUM PHOSPHATE 10 MG/ML IJ SOLN
INTRAMUSCULAR | Status: AC
  Filled 2017-10-07: qty 1

## 2017-10-07 MED ORDER — ONDANSETRON HCL 4 MG/2ML IJ SOLN
INTRAMUSCULAR | Status: AC
  Filled 2017-10-07: qty 2

## 2017-10-07 MED ORDER — ROCURONIUM BROMIDE 10 MG/ML (PF) SYRINGE
PREFILLED_SYRINGE | INTRAVENOUS | Status: DC | PRN
  Administered 2017-10-07: 10 mg via INTRAVENOUS
  Administered 2017-10-07: 50 mg via INTRAVENOUS

## 2017-10-07 MED ORDER — LACTATED RINGERS IV SOLN
INTRAVENOUS | Status: DC
  Administered 2017-10-07: 16:00:00 via INTRAVENOUS

## 2017-10-07 MED ORDER — CHLORHEXIDINE GLUCONATE CLOTH 2 % EX PADS: 6.0000 | MEDICATED_PAD | Freq: Once | CUTANEOUS | Status: DC

## 2017-10-07 MED ORDER — MIDAZOLAM HCL 2 MG/2ML IJ SOLN
INTRAMUSCULAR | Status: AC
  Filled 2017-10-07: qty 2

## 2017-10-07 MED ORDER — ONDANSETRON HCL 4 MG/2ML IJ SOLN
INTRAMUSCULAR | Status: DC | PRN
  Administered 2017-10-07: 4 mg via INTRAVENOUS

## 2017-10-07 MED ORDER — OXYCODONE HCL 5 MG PO TABS: 5.0000 mg | ORAL_TABLET | Freq: Four times a day (QID) | ORAL | 0 refills | Status: DC | PRN

## 2017-10-07 MED ORDER — FENTANYL CITRATE (PF) 100 MCG/2ML IJ SOLN
25.0000 ug | INTRAMUSCULAR | Status: DC | PRN
  Administered 2017-10-07: 50 ug via INTRAVENOUS

## 2017-10-07 MED ORDER — HEMOSTATIC AGENTS (NO CHARGE) OPTIME
TOPICAL | Status: DC | PRN
  Administered 2017-10-07: 1 via TOPICAL

## 2017-10-07 MED ORDER — 0.9 % SODIUM CHLORIDE (POUR BTL) OPTIME
TOPICAL | Status: DC | PRN
  Administered 2017-10-07: 1000 mL

## 2017-10-07 MED ORDER — MIDAZOLAM HCL 5 MG/5ML IJ SOLN
INTRAMUSCULAR | Status: DC | PRN
  Administered 2017-10-07: 2 mg via INTRAVENOUS

## 2017-10-07 MED ORDER — PROPOFOL 10 MG/ML IV BOLUS
INTRAVENOUS | Status: DC | PRN
  Administered 2017-10-07: 140 mg via INTRAVENOUS

## 2017-10-07 MED ORDER — ALBUTEROL SULFATE HFA 108 (90 BASE) MCG/ACT IN AERS
INHALATION_SPRAY | RESPIRATORY_TRACT | Status: DC | PRN
  Administered 2017-10-07: 6 via RESPIRATORY_TRACT

## 2017-10-07 MED ORDER — SUGAMMADEX SODIUM 200 MG/2ML IV SOLN
INTRAVENOUS | Status: AC
  Filled 2017-10-07: qty 4

## 2017-10-07 MED ORDER — LIDOCAINE 2% (20 MG/ML) 5 ML SYRINGE
INTRAMUSCULAR | Status: DC | PRN
  Administered 2017-10-07: 100 mg via INTRAVENOUS

## 2017-10-07 MED ORDER — SODIUM CHLORIDE 0.9 % IV SOLN
INTRAVENOUS | Status: DC | PRN
  Administered 2017-10-07: 7 mL

## 2017-10-07 MED ORDER — SUGAMMADEX SODIUM 200 MG/2ML IV SOLN
INTRAVENOUS | Status: DC | PRN
  Administered 2017-10-07: 200 mg via INTRAVENOUS

## 2017-10-07 MED ORDER — DEXAMETHASONE SODIUM PHOSPHATE 10 MG/ML IJ SOLN
INTRAMUSCULAR | Status: DC | PRN
  Administered 2017-10-07: 5 mg via INTRAVENOUS

## 2017-10-07 MED ORDER — FENTANYL CITRATE (PF) 100 MCG/2ML IJ SOLN
INTRAMUSCULAR | Status: AC
  Filled 2017-10-07: qty 2

## 2017-10-07 MED ORDER — FENTANYL CITRATE (PF) 250 MCG/5ML IJ SOLN
INTRAMUSCULAR | Status: AC
  Filled 2017-10-07: qty 5

## 2017-10-07 MED ORDER — LIDOCAINE 2% (20 MG/ML) 5 ML SYRINGE
INTRAMUSCULAR | Status: AC
  Filled 2017-10-07: qty 5

## 2017-10-07 MED ORDER — PHENYLEPHRINE 40 MCG/ML (10ML) SYRINGE FOR IV PUSH (FOR BLOOD PRESSURE SUPPORT)
PREFILLED_SYRINGE | INTRAVENOUS | Status: AC
  Filled 2017-10-07: qty 10

## 2017-10-07 MED ORDER — HYDROCODONE-ACETAMINOPHEN 7.5-325 MG PO TABS: 1.0000 | ORAL_TABLET | Freq: Once | ORAL | Status: DC | PRN

## 2017-10-07 MED ORDER — BUPIVACAINE-EPINEPHRINE 0.25% -1:200000 IJ SOLN
INTRAMUSCULAR | Status: DC | PRN
  Administered 2017-10-07: 11 mL

## 2017-10-07 MED ORDER — PROPOFOL 10 MG/ML IV BOLUS
INTRAVENOUS | Status: AC
  Filled 2017-10-07: qty 20

## 2017-10-07 MED ORDER — FENTANYL CITRATE (PF) 250 MCG/5ML IJ SOLN
INTRAMUSCULAR | Status: DC | PRN
  Administered 2017-10-07: 50 ug via INTRAVENOUS
  Administered 2017-10-07: 150 ug via INTRAVENOUS
  Administered 2017-10-07: 50 ug via INTRAVENOUS

## 2017-10-07 MED ORDER — OXYCODONE HCL 5 MG PO TABS
ORAL_TABLET | ORAL | Status: AC
  Filled 2017-10-07: qty 1

## 2017-10-07 MED ORDER — PROMETHAZINE HCL 25 MG/ML IJ SOLN: 6.2500 mg | INTRAMUSCULAR | Status: DC | PRN

## 2017-10-07 MED ORDER — PHENYLEPHRINE HCL 10 MG/ML IJ SOLN
INTRAMUSCULAR | Status: DC | PRN
  Administered 2017-10-07: 40 ug via INTRAVENOUS

## 2017-10-07 SURGICAL SUPPLY — 43 items
APPLIER CLIP ROT 10 11.4 M/L (STAPLE) ×3
BENZOIN TINCTURE PRP APPL 2/3 (GAUZE/BANDAGES/DRESSINGS) ×3 IMPLANT
BLADE CLIPPER SURG (BLADE) IMPLANT
CANISTER SUCT 3000ML PPV (MISCELLANEOUS) ×3 IMPLANT
CHLORAPREP W/TINT 26ML (MISCELLANEOUS) ×3 IMPLANT
CLIP APPLIE ROT 10 11.4 M/L (STAPLE) ×1 IMPLANT
CLOSURE WOUND 1/2 X4 (GAUZE/BANDAGES/DRESSINGS) ×1
COVER MAYO STAND STRL (DRAPES) ×3 IMPLANT
COVER SURGICAL LIGHT HANDLE (MISCELLANEOUS) ×3 IMPLANT
DRAPE C-ARM 42X72 X-RAY (DRAPES) ×6 IMPLANT
DRSG TEGADERM 2-3/8X2-3/4 SM (GAUZE/BANDAGES/DRESSINGS) ×3 IMPLANT
DRSG TEGADERM 4X4.75 (GAUZE/BANDAGES/DRESSINGS) ×3 IMPLANT
ELECT REM PT RETURN 9FT ADLT (ELECTROSURGICAL) ×3
ELECTRODE REM PT RTRN 9FT ADLT (ELECTROSURGICAL) ×1 IMPLANT
FILTER SMOKE EVAC LAPAROSHD (FILTER) ×3 IMPLANT
GAUZE SPONGE 2X2 8PLY STRL LF (GAUZE/BANDAGES/DRESSINGS) ×1 IMPLANT
GLOVE BIO SURGEON STRL SZ7 (GLOVE) ×3 IMPLANT
GLOVE BIOGEL PI IND STRL 7.5 (GLOVE) ×1 IMPLANT
GLOVE BIOGEL PI INDICATOR 7.5 (GLOVE) ×2
GOWN STRL REUS W/ TWL LRG LVL3 (GOWN DISPOSABLE) ×3 IMPLANT
GOWN STRL REUS W/TWL LRG LVL3 (GOWN DISPOSABLE) ×6
HEMOSTAT SNOW SURGICEL 2X4 (HEMOSTASIS) ×3 IMPLANT
KIT BASIN OR (CUSTOM PROCEDURE TRAY) ×3 IMPLANT
KIT ROOM TURNOVER OR (KITS) ×3 IMPLANT
NS IRRIG 1000ML POUR BTL (IV SOLUTION) ×3 IMPLANT
PAD ARMBOARD 7.5X6 YLW CONV (MISCELLANEOUS) ×3 IMPLANT
POUCH SPECIMEN RETRIEVAL 10MM (ENDOMECHANICALS) ×3 IMPLANT
SCISSORS LAP 5X35 DISP (ENDOMECHANICALS) ×3 IMPLANT
SET CHOLANGIOGRAPH 5 50 .035 (SET/KITS/TRAYS/PACK) ×3 IMPLANT
SET IRRIG TUBING LAPAROSCOPIC (IRRIGATION / IRRIGATOR) ×3 IMPLANT
SLEEVE ENDOPATH XCEL 5M (ENDOMECHANICALS) ×3 IMPLANT
SPECIMEN JAR SMALL (MISCELLANEOUS) ×3 IMPLANT
SPONGE GAUZE 2X2 STER 10/PKG (GAUZE/BANDAGES/DRESSINGS) ×2
STRIP CLOSURE SKIN 1/2X4 (GAUZE/BANDAGES/DRESSINGS) ×2 IMPLANT
SUT MNCRL AB 4-0 PS2 18 (SUTURE) ×3 IMPLANT
TOWEL OR 17X24 6PK STRL BLUE (TOWEL DISPOSABLE) ×3 IMPLANT
TOWEL OR 17X26 10 PK STRL BLUE (TOWEL DISPOSABLE) ×3 IMPLANT
TRAY LAPAROSCOPIC MC (CUSTOM PROCEDURE TRAY) ×3 IMPLANT
TROCAR XCEL BLUNT TIP 100MML (ENDOMECHANICALS) ×3 IMPLANT
TROCAR XCEL NON-BLD 11X100MML (ENDOMECHANICALS) ×3 IMPLANT
TROCAR XCEL NON-BLD 5MMX100MML (ENDOMECHANICALS) ×3 IMPLANT
TUBING INSUFFLATION (TUBING) ×3 IMPLANT
WATER STERILE IRR 1000ML POUR (IV SOLUTION) ×3 IMPLANT

## 2017-10-07 NOTE — Anesthesia Preprocedure Evaluation (Addendum)
Anesthesia Evaluation  Patient identified by MRN, date of birth, ID band Patient awake    Reviewed: Allergy & Precautions, NPO status , Patient's Chart, lab work & pertinent test results  Airway Mallampati: II  TM Distance: >3 FB Neck ROM: Full    Dental  (+) Dental Advisory Given, Teeth Intact   Pulmonary neg pulmonary ROS,    Pulmonary exam normal breath sounds clear to auscultation       Cardiovascular hypertension, Pt. on medications Normal cardiovascular exam Rhythm:Regular Rate:Normal     Neuro/Psych  Headaches, Anxiety Depression    GI/Hepatic GERD  Controlled and Medicated,(+)     substance abuse  , Narcotic abuse   Endo/Other  Obesity  Renal/GU negative Renal ROS  negative genitourinary   Musculoskeletal negative musculoskeletal ROS (+)   Abdominal   Peds  (+) ADHD Hematology negative hematology ROS (+)   Anesthesia Other Findings   Reproductive/Obstetrics                            Anesthesia Physical Anesthesia Plan  ASA: II  Anesthesia Plan: General   Post-op Pain Management:    Induction: Intravenous  PONV Risk Score and Plan: 3 and Treatment may vary due to age or medical condition, Midazolam, Dexamethasone and Ondansetron  Airway Management Planned: Oral ETT  Additional Equipment: None  Intra-op Plan:   Post-operative Plan: Extubation in OR  Informed Consent: I have reviewed the patients History and Physical, chart, labs and discussed the procedure including the risks, benefits and alternatives for the proposed anesthesia with the patient or authorized representative who has indicated his/her understanding and acceptance.   Dental advisory given  Plan Discussed with: CRNA  Anesthesia Plan Comments:         Anesthesia Quick Evaluation

## 2017-10-07 NOTE — Discharge Instructions (Signed)
[facility] ______[facility] °LAPAROSCOPIC SURGERY: POST OP INSTRUCTIONS °Always review your discharge instruction sheet given to you by the facility where your surgery was performed. °IF YOU HAVE DISABILITY OR FAMILY LEAVE FORMS, YOU MUST BRING THEM TO THE OFFICE FOR PROCESSING.   °DO NOT GIVE THEM TO YOUR DOCTOR. ° °1. A prescription for pain medication may be given to you upon discharge.  Take your pain medication as prescribed, if needed.  If narcotic pain medicine is not needed, then you may take acetaminophen (Tylenol) or ibuprofen (Advil) as needed. °2. Take your usually prescribed medications unless otherwise directed. °3. If you need a refill on your pain medication, please contact your pharmacy.  They will contact our office to request authorization. Prescriptions will not be filled after 5pm or on week-ends. °4. You should follow a light diet the first few days after arrival home, such as soup and crackers, etc.  Be sure to include lots of fluids daily. °5. Most patients will experience some swelling and bruising in the area of the incisions.  Ice packs will help.  Swelling and bruising can take several days to resolve.  °6. It is common to experience some constipation if taking pain medication after surgery.  Increasing fluid intake and taking a stool softener (such as Colace) will usually help or prevent this problem from occurring.  A mild laxative (Milk of Magnesia or Miralax) should be taken according to package instructions if there are no bowel movements after 48 hours. °7. Unless discharge instructions indicate otherwise, you may remove your bandages 24-48 hours after surgery, and you may shower at that time.  You may have steri-strips (small skin tapes) in place directly over the incision.  These strips should be left on the skin for 7-10 days.  If your surgeon used skin glue on the incision, you may shower in 24 hours.  The glue will flake off over the next 2-3 weeks.  Any sutures or  staples will be removed at the office during your follow-up visit. °8. ACTIVITIES:  You may resume regular (light) daily activities beginning the next day--such as daily self-care, walking, climbing stairs--gradually increasing activities as tolerated.  You may have sexual intercourse when it is comfortable.  Refrain from any heavy lifting or straining until approved by your doctor. °a. You may drive when you are no longer taking prescription pain medication, you can comfortably wear a seatbelt, and you can safely maneuver your car and apply brakes. °b. RETURN TO WORK:  __________________________________________________________ °9. You should see your doctor in the office for a follow-up appointment approximately 2-3 weeks after your surgery.  Make sure that you call for this appointment within a day or two after you arrive home to insure a convenient appointment time. °10. OTHER INSTRUCTIONS: __________________________________________________________________________________________________________________________ __________________________________________________________________________________________________________________________ °WHEN TO CALL YOUR DOCTOR: °1. Fever over 101.0 °2. Inability to urinate °3. Continued bleeding from incision. °4. Increased pain, redness, or drainage from the incision. °5. Increasing abdominal pain ° °The clinic staff is available to answer your questions during regular business hours.  Please don’t hesitate to call and ask to speak to one of the nurses for clinical concerns.  If you have a medical emergency, go to the nearest emergency room or call 911.  A surgeon from [facility] is always on call at the hospital. °[address], [address], [place], [place]  [address] ? [address] [address], , [place]   [address] °[phone] ? [phone] ? FAX [phone] °Web site:   [email or web address] °

## 2017-10-07 NOTE — Interval H&P Note (Signed)
History and Physical Interval Note:  10/07/2017 1:47 PM  Brittney Manning  has presented today for surgery, with the diagnosis of Chronic calculus cholecystitis  The various methods of treatment have been discussed with the patient and family. After consideration of risks, benefits and other options for treatment, the patient has consented to  Procedure(s): LAPAROSCOPIC CHOLECYSTECTOMY WITH INTRAOPERATIVE CHOLANGIOGRAM (N/A) as a surgical intervention .  The patient's history has been reviewed, patient examined, no change in status, stable for surgery.  I have reviewed the patient's chart and labs.  Questions were answered to the patient's satisfaction.     Wynona LunaMatthew K Bentlie Catanzaro

## 2017-10-07 NOTE — Anesthesia Procedure Notes (Signed)
Procedure Name: Intubation Date/Time: 10/07/2017 3:54 PM Performed by: White, Amedeo Plenty, CRNA Pre-anesthesia Checklist: Patient identified, Emergency Drugs available, Suction available and Patient being monitored Patient Re-evaluated:Patient Re-evaluated prior to induction Oxygen Delivery Method: Circle System Utilized Preoxygenation: Pre-oxygenation with 100% oxygen Induction Type: IV induction Ventilation: Mask ventilation without difficulty Laryngoscope Size: Mac and 3 Grade View: Grade I Tube type: Oral Tube size: 7.0 mm Number of attempts: 1 Airway Equipment and Method: Stylet Placement Confirmation: ETT inserted through vocal cords under direct vision,  positive ETCO2 and breath sounds checked- equal and bilateral Secured at: 21 cm Tube secured with: Tape Dental Injury: Teeth and Oropharynx as per pre-operative assessment

## 2017-10-07 NOTE — Transfer of Care (Signed)
Immediate Anesthesia Transfer of Care Note  Patient: Brittney Manning  Procedure(s) Performed: LAPAROSCOPIC CHOLECYSTECTOMY WITH INTRAOPERATIVE CHOLANGIOGRAM (N/A Abdomen)  Patient Location: PACU  Anesthesia Type:General  Level of Consciousness: awake, alert , oriented and patient cooperative  Airway & Oxygen Therapy: Patient Spontanous Breathing  Post-op Assessment: Report given to RN and Post -op Vital signs reviewed and stable  Post vital signs: Reviewed and stable  Last Vitals:  Vitals:   10/07/17 1357  BP: 128/65  Pulse: 79  Resp: 20  Temp: 36.9 C  SpO2: 100%    Last Pain:  Vitals:   10/07/17 1357  TempSrc: Oral         Complications: No apparent anesthesia complications

## 2017-10-07 NOTE — Op Note (Signed)
Laparoscopic Cholecystectomy with IOC Procedure Note  Indications: This patient presents with symptomatic gallbladder disease and will undergo laparoscopic cholecystectomy.  This is a 42 year old female who presents with at least a 5 year history of intermittent right-sided abdominal pain. Over the last 3 months this has become more frequent and much more severe. This test happen after eating meals in the evening. Fried foods seem to be worse. She does not have a nausea or diarrhea but she does have significant abdominal bloating and flatulence. She had gallstones documented on ultrasound previously. Her studies have not been repeated. She does report a couple of episodes of very dark urine and a yellowish tinge to her skin. She is not jaundiced today.    Pre-operative Diagnosis: Calculus of gallbladder with other cholecystitis, without mention of obstruction  Post-operative Diagnosis: Same  Surgeon: Wynona Luna   Assistants: none  Anesthesia: General endotracheal anesthesia  ASA Class: 2  Procedure Details  The patient was seen again in the Holding Room. The risks, benefits, complications, treatment options, and expected outcomes were discussed with the patient. The possibilities of reaction to medication, pulmonary aspiration, perforation of viscus, bleeding, recurrent infection, finding a normal gallbladder, the need for additional procedures, failure to diagnose a condition, the possible need to convert to an open procedure, and creating a complication requiring transfusion or operation were discussed with the patient. The likelihood of improving the patient's symptoms with return to their baseline status is good.  The patient and/or family concurred with the proposed plan, giving informed consent. The site of surgery properly noted. The patient was taken to Operating Room, identified as Brittney Manning and the procedure verified as Laparoscopic Cholecystectomy with  Intraoperative Cholangiogram. A Time Out was held and the above information confirmed.  Prior to the induction of general anesthesia, antibiotic prophylaxis was administered. General endotracheal anesthesia was then administered and tolerated well. After the induction, the abdomen was prepped with Chloraprep and draped in the sterile fashion. The patient was positioned in the supine position.  Local anesthetic agent was injected into the skin near the umbilicus and an incision made. We dissected down to the abdominal fascia with blunt dissection.  The fascia was incised vertically and we entered the peritoneal cavity bluntly.  A pursestring suture of 0-Vicryl was placed around the fascial opening.  The Hasson cannula was inserted and secured with the stay suture.  Pneumoperitoneum was then created with CO2 and tolerated well without any adverse changes in the patient's vital signs. An 11-mm port was placed in the subxiphoid position.  Two 5-mm ports were placed in the right upper quadrant. All skin incisions were infiltrated with a local anesthetic agent before making the incision and placing the trocars.   We positioned the patient in reverse Trendelenburg, tilted slightly to the patient's left.  The gallbladder was identified, the fundus grasped and retracted cephalad.  There are significant omental adhesions to the fundus of the gallbladder. Adhesions were lysed bluntly and with the electrocautery where indicated, taking care not to injure any adjacent organs or viscus. The infundibulum was grasped and retracted laterally, exposing the peritoneum overlying the triangle of Calot. This was then divided and exposed in a blunt fashion. A critical view of the cystic duct and cystic artery was obtained.  The cystic duct was clearly identified and bluntly dissected circumferentially. The cystic duct was ligated with a clip distally.   An incision was made in the cystic duct and the [facility] cholangiogram catheter  introduced. The  catheter was secured using a clip. A cholangiogram was then obtained which showed good visualization of the distal and proximal biliary tree with no sign of filling defects or obstruction.  There is minimal cystic duct stump.  Contrast flowed easily into the duodenum. The catheter was then removed.   The cystic duct was then ligated with clips and divided. The cystic artery was identified, dissected free, ligated with clips and divided as well.   The gallbladder was dissected from the liver bed in retrograde fashion with the electrocautery. The gallbladder was removed and placed in an Endocatch sac. The liver bed was irrigated and inspected. Hemostasis was achieved with the electrocautery and Surgicel SNOW. Copious irrigation was utilized and was repeatedly aspirated until clear.  The gallbladder and Endocatch sac were then removed through the umbilical port site.  The pursestring suture was used to close the umbilical fascia.    We again inspected the right upper quadrant for hemostasis.  Pneumoperitoneum was released as we removed the trocars.  4-0 Monocryl was used to close the skin.   Benzoin, steri-strips, and clean dressings were applied. The patient was then extubated and brought to the recovery room in stable condition. Instrument, sponge, and needle counts were correct at closure and at the conclusion of the case.   Findings: Cholecystitis with Cholelithiasis  Estimated Blood Loss: less than 50 mL         Drains: none         Specimens: Gallbladder           Complications: None; patient tolerated the procedure well.         Disposition: PACU - hemodynamically stable.         Condition: stable  Brittney ArmsMatthew K. Corliss Skainssuei, MD, [facility]  General/ Trauma Surgery  10/07/2017 4:57 PM

## 2017-10-08 ENCOUNTER — Encounter (HOSPITAL_COMMUNITY): Payer: Self-pay | Admitting: Surgery

## 2017-10-08 NOTE — Anesthesia Postprocedure Evaluation (Signed)
Anesthesia Post Note  Patient: Brittney Manning  Procedure(s) Performed: LAPAROSCOPIC CHOLECYSTECTOMY WITH INTRAOPERATIVE CHOLANGIOGRAM (N/A Abdomen)     Patient location during evaluation: PACU Anesthesia Type: General Level of consciousness: awake and alert Pain management: pain level controlled Vital Signs Assessment: post-procedure vital signs reviewed and stable Respiratory status: spontaneous breathing, nonlabored ventilation, respiratory function stable and patient connected to nasal cannula oxygen Cardiovascular status: blood pressure returned to baseline and stable Postop Assessment: no apparent nausea or vomiting Anesthetic complications: no    Last Vitals:  Vitals:   10/07/17 1800 10/07/17 1815  BP:    Pulse: 66 65  Resp: 13 16  Temp:  (!) 36.3 C  SpO2: 92% 92%    Last Pain:  Vitals:   10/07/17 1815  TempSrc:   PainSc: Asleep                 Alson Mcpheeters

## 2018-03-26 ENCOUNTER — Emergency Department (HOSPITAL_COMMUNITY)
Admission: EM | Admit: 2018-03-26 | Discharge: 2018-03-26 | Disposition: A | Discharge status: Home / Self Care | Payer: Medicaid Other | Attending: Emergency Medicine | Admitting: Emergency Medicine

## 2018-03-26 ENCOUNTER — Encounter (HOSPITAL_COMMUNITY): Payer: Self-pay | Admitting: Emergency Medicine

## 2018-03-26 DIAGNOSIS — K0889 Other specified disorders of teeth and supporting structures: Secondary | ICD-10-CM | POA: Diagnosis present

## 2018-03-26 DIAGNOSIS — I10 Essential (primary) hypertension: Secondary | ICD-10-CM | POA: Insufficient documentation

## 2018-03-26 DIAGNOSIS — K029 Dental caries, unspecified: Secondary | ICD-10-CM | POA: Diagnosis not present

## 2018-03-26 DIAGNOSIS — Z79899 Other long term (current) drug therapy: Secondary | ICD-10-CM | POA: Insufficient documentation

## 2018-03-26 DIAGNOSIS — F909 Attention-deficit hyperactivity disorder, unspecified type: Secondary | ICD-10-CM | POA: Insufficient documentation

## 2018-03-26 MED ORDER — DICLOFENAC SODIUM ER 100 MG PO TB24: 100.0000 mg | ORAL_TABLET | Freq: Every day | ORAL | 0 refills | Status: AC

## 2018-03-26 MED ORDER — NAPROXEN 250 MG PO TABS
375.0000 mg | ORAL_TABLET | Freq: Two times a day (BID) | ORAL | Status: DC
  Administered 2018-03-26: 375 mg via ORAL
  Filled 2018-03-26: qty 2

## 2018-03-26 MED ORDER — PENICILLIN V POTASSIUM 500 MG PO TABS: 500.0000 mg | ORAL_TABLET | Freq: Four times a day (QID) | ORAL | 0 refills | Status: AC

## 2018-03-26 MED ORDER — LIDOCAINE VISCOUS HCL 2 % MT SOLN
15.0000 mL | Freq: Once | OROMUCOSAL | Status: AC
  Administered 2018-03-26: 15 mL via OROMUCOSAL
  Filled 2018-03-26: qty 15

## 2018-03-26 MED ORDER — PENICILLIN V POTASSIUM 250 MG PO TABS
500.0000 mg | ORAL_TABLET | Freq: Once | ORAL | Status: AC
  Administered 2018-03-26: 500 mg via ORAL
  Filled 2018-03-26: qty 2

## 2018-03-26 NOTE — ED Provider Notes (Addendum)
[facility] Provider Note   CSN: [id number] Arrival date & time: 03/26/18  0542     History   Chief Complaint Chief Complaint  Patient presents with  . Dental Pain    HPI Brittney Manning is a 42 y.o. female.  The history is provided by the patient. No language interpreter was used.  Dental Pain   This is a chronic problem. The current episode started more than 1 week ago (6 months). The problem occurs constantly. The problem has not changed since onset.The pain is at a severity of 10/10. The pain is severe. She has tried acetaminophen for the symptoms. The treatment provided no relief.    Past Medical History:  Diagnosis Date  . ADHD (attention deficit hyperactivity disorder)   . Anxiety   . Blood in stool   . Chronic cholecystitis with calculus   . Depression   . Headache(784.0)   . Hypertension   . MRSA (methicillin resistant staph aureus) culture positive    at C section site 05-2009 resolved  . Narcotic abuse (HCC)   . Pneumonia    as a child    Patient Active Problem List   Diagnosis Date Noted  . CERUMEN IMPACTION 08/25/2009  . EAR PAIN 08/25/2009  . APHTHOUS ULCERS 08/25/2009  . ANXIETY 09/28/2008  . NARCOTIC ABUSE 08/31/2008  . DEPRESSION 08/31/2008  . ACUTE CYSTITIS 08/31/2008  . ADHD 03/22/2008  . HYPERTENSION 03/22/2008  . VIRAL INFECTION 05/12/2007  . HEADACHE 03/27/2007    Past Surgical History:  Procedure Laterality Date  . CESAREAN SECTION    . CHOLECYSTECTOMY N/A 10/07/2017   Procedure: LAPAROSCOPIC CHOLECYSTECTOMY WITH INTRAOPERATIVE CHOLANGIOGRAM;  Surgeon: Manus Ruddsuei, Matthew, MD;  [facility];  Service: General;  Laterality: N/A;  . COLONOSCOPY    . KNEE ARTHROSCOPY  2012   left knee, per Dr. Thurston HoleWainer   . TUBAL LIGATION    . TYMPANOPLASTY  Sept. 2012 and May 2013   right ear twice, [facility]  . TYMPANOSTOMY TUBE PLACEMENT     rt ear 2009 dr shoemaker     OB History    None      Home Medications    Prior to Admission medications   Medication Sig Start Date End Date Taking? Authorizing Provider  acetaminophen (TYLENOL) 500 MG tablet Take 1,000 mg by mouth every 6 (six) hours as needed for moderate pain or headache.   Yes [provider]  amphetamine-dextroamphetamine (ADDERALL) 30 MG tablet Take 1 tablet by mouth 2 (two) times daily. 06/27/14  Yes Nelwyn SalisburyFry, Stephen A, MD  Cholecalciferol (VITAMIN D3 PO) Take 1 capsule by mouth daily.   Yes [provider]  escitalopram (LEXAPRO) 20 MG tablet TAKE 1 TABLET BY MOUTH EVERY DAY Patient taking differently: TAKE 20 MG BY MOUTH EVERY DAY 07/06/14  Yes Nelwyn SalisburyFry, Stephen A, MD  ferrous sulfate 325 (65 FE) MG tablet Take 325 mg by mouth daily with breakfast.   Yes [provider]  ibuprofen (ADVIL,MOTRIN) 200 MG tablet Take 1,200 mg by mouth every 6 (six) hours as needed for headache or mild pain.   Yes [provider]  lisinopril (PRINIVIL,ZESTRIL) 20 MG tablet Take 1 tablet (20 mg total) by mouth daily. 08/20/13  Yes Nelwyn SalisburyFry, Stephen A, MD  Diclofenac Sodium CR 100 MG 24 hr tablet Take 1 tablet (100 mg total) by mouth daily. 03/26/18   Devanie Galanti, MD  penicillin v potassium (VEETID) 500 MG tablet Take 1  tablet (500 mg total) by mouth 4 (four) times daily for 7 days. 03/26/18 04/02/18  Trinten Boudoin, MD    Family History Family History  Problem Relation Age of Onset  . Hypertension Unknown   . Diabetes Unknown   . Alcohol abuse Unknown   . Drug abuse Unknown        cocaine addiction   . Cirrhosis Mother   . Hypertension Father   . Diabetes Father     Social History Social History   Tobacco Use  . Smoking status: Never Smoker  . Smokeless tobacco: Never Used  Substance Use Topics  . Alcohol use: Yes    Comment: occ  . Drug use: No     Allergies   Patient has no known allergies.   Review of Systems Review of Systems  Constitutional: Negative for chills, diaphoresis  and fever.  HENT: Positive for dental problem. Negative for facial swelling, tinnitus and voice change.   Respiratory: Negative for shortness of breath.   Cardiovascular: Negative for chest pain.  All other systems reviewed and are negative.    Physical Exam Updated Vital Signs BP (!) 149/90   Pulse 85   Temp 97.9 F (36.6 C) (Oral)   Resp 20   SpO2 100%   Physical Exam  Constitutional: She is oriented to person, place, and time. She appears well-developed and well-nourished. No distress.  HENT:  Head: Normocephalic and atraumatic.  Mouth/Throat: No trismus in the jaw. No uvula swelling. No oropharyngeal exudate.    Eyes: Pupils are equal, round, and reactive to light. Conjunctivae are normal.  Neck: Normal range of motion. Neck supple.  Cardiovascular: Normal rate, regular rhythm, normal heart sounds and intact distal pulses.  Pulmonary/Chest: Effort normal and breath sounds normal. No stridor. She has no wheezes. She has no rales.  Abdominal: Soft. Bowel sounds are normal. She exhibits no mass. There is no tenderness. There is no rebound and no guarding.  Musculoskeletal: Normal range of motion. She exhibits no tenderness or deformity.  Lymphadenopathy:    She has no cervical adenopathy.  Neurological: She is alert and oriented to person, place, and time. She displays normal reflexes.  Skin: Skin is warm and dry. Capillary refill takes less than 2 seconds.  Psychiatric: She has a normal mood and affect.     ED Treatments / Results  Labs (all labs ordered are listed, but only abnormal results are displayed) Labs Reviewed - No data to display  EKG None  Radiology No results found.  Procedures Procedures (including critical care time)  Medications Ordered in ED Medications  naproxen (NAPROSYN) tablet 375 mg (has no administration in time range)  penicillin v potassium (VEETID) tablet 500 mg (500 mg Oral Given 03/26/18 0640)  lidocaine (XYLOCAINE) 2 % viscous  mouth solution 15 mL (15 mLs Mouth/Throat Given 03/26/18 0641)       Final Clinical Impressions(s) / ED Diagnoses   Final diagnoses:  Pain due to dental caries  Dental caries    Based on department policy and given patient's h/o narcotic abuse on chronic narcotics (see DEA database this patient is receiving 60# tramadol monthly from her PMD) I will not be prescribing narcotics for this chronic problem in a patient with ongoing narcotic use and history of abuse.  The patient is attempting to negotiate narcotics and I have told her politely that I apologize for her inconvenience but I have prescribed voltaren.    Return for numbness, changes in vision or speech, fevers >100.4  unrelieved by medication, shortness of breath, intractable vomiting, or diarrhea, abdominal pain, Inability to tolerate liquids or food, cough, altered mental status or any concerns. No signs of systemic illness or infection. The patient is nontoxic-appearing on exam and vital signs are within normal limits. Will refer to urology for microscopy hematuria as patient is asymptomatic.  I have reviewed the triage vital signs and the nursing notes. Pertinent labs &imaging results that were available during my care of the patient were reviewed by me and considered in my medical decision making (see chart for details).  After history, exam, and medical workup I feel the patient has been appropriately medically screened and is safe for discharge home. Pertinent diagnoses were discussed with the patient. Patient was given return precautions.  ED Discharge Orders         Ordered    penicillin v potassium (VEETID) 500 MG tablet  4 times daily     03/26/18 0648    Diclofenac Sodium CR 100 MG 24 hr tablet  Daily     03/26/18 0648           Manar Smalling, MD 03/26/18 0700    Saory Carriero, MD 03/26/18 [phone]

## 2018-03-26 NOTE — ED Triage Notes (Signed)
Pt c/o L Lower dental pain x 1.5 weeks, has taken different pain meds without relief. Does not have a dentist she sees regularly

## 2018-03-26 NOTE — ED Notes (Signed)
Pt stating she needs something stronger for pain. Pt notified of prescriptions. EDP at bedside.

## 2018-03-26 NOTE — ED Notes (Signed)
ED Provider at bedside. 

## 2018-04-25 ENCOUNTER — Encounter (HOSPITAL_COMMUNITY): Payer: Self-pay

## 2018-04-25 ENCOUNTER — Emergency Department (HOSPITAL_COMMUNITY)
Admission: EM | Admit: 2018-04-25 | Discharge: 2018-04-25 | Disposition: A | Discharge status: Home / Self Care | Payer: Self-pay | Attending: Emergency Medicine | Admitting: Emergency Medicine

## 2018-04-25 DIAGNOSIS — I10 Essential (primary) hypertension: Secondary | ICD-10-CM | POA: Insufficient documentation

## 2018-04-25 DIAGNOSIS — T63301A Toxic effect of unspecified spider venom, accidental (unintentional), initial encounter: Secondary | ICD-10-CM | POA: Insufficient documentation

## 2018-04-25 DIAGNOSIS — Z79899 Other long term (current) drug therapy: Secondary | ICD-10-CM | POA: Insufficient documentation

## 2018-04-25 DIAGNOSIS — M542 Cervicalgia: Secondary | ICD-10-CM | POA: Insufficient documentation

## 2018-04-25 DIAGNOSIS — T63303A Toxic effect of unspecified spider venom, assault, initial encounter: Secondary | ICD-10-CM

## 2018-04-25 MED ORDER — SULFAMETHOXAZOLE-TRIMETHOPRIM 800-160 MG PO TABS: 1.0000 | ORAL_TABLET | Freq: Two times a day (BID) | ORAL | 0 refills | Status: AC

## 2018-04-25 MED ORDER — SULFAMETHOXAZOLE-TRIMETHOPRIM 800-160 MG PO TABS
1.0000 | ORAL_TABLET | Freq: Once | ORAL | Status: AC
  Administered 2018-04-25: 1 via ORAL
  Filled 2018-04-25: qty 1
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
## 2018-04-25 NOTE — ED Triage Notes (Signed)
Pt states that she seen a spider bite her several days ago on the L side of her neck, with green drainage, denies fevers, been having body aches since.

## 2018-06-12 ENCOUNTER — Encounter (HOSPITAL_COMMUNITY): Payer: Self-pay | Admitting: Emergency Medicine

## 2018-06-12 ENCOUNTER — Other Ambulatory Visit: Payer: Self-pay

## 2018-06-12 ENCOUNTER — Emergency Department (HOSPITAL_COMMUNITY)
Admission: EM | Admit: 2018-06-12 | Discharge: 2018-06-12 | Disposition: A | Discharge status: Home / Self Care | Payer: Self-pay | Attending: Emergency Medicine | Admitting: Emergency Medicine

## 2018-06-12 DIAGNOSIS — Z8614 Personal history of Methicillin resistant Staphylococcus aureus infection: Secondary | ICD-10-CM | POA: Insufficient documentation

## 2018-06-12 DIAGNOSIS — I10 Essential (primary) hypertension: Secondary | ICD-10-CM | POA: Insufficient documentation

## 2018-06-12 DIAGNOSIS — F909 Attention-deficit hyperactivity disorder, unspecified type: Secondary | ICD-10-CM | POA: Insufficient documentation

## 2018-06-12 DIAGNOSIS — L0103 Bullous impetigo: Secondary | ICD-10-CM | POA: Insufficient documentation

## 2018-06-12 DIAGNOSIS — Z79899 Other long term (current) drug therapy: Secondary | ICD-10-CM | POA: Insufficient documentation

## 2018-06-12 LAB — URINALYSIS, ROUTINE W REFLEX MICROSCOPIC
Bilirubin Urine: NEGATIVE
Glucose, UA: NEGATIVE mg/dL
Hgb urine dipstick: NEGATIVE
Ketones, ur: NEGATIVE mg/dL
LEUKOCYTES UA: NEGATIVE
NITRITE: NEGATIVE
PH: 7 (ref 5.0–8.0)
Protein, ur: NEGATIVE mg/dL
SPECIFIC GRAVITY, URINE: 1.027 (ref 1.005–1.030)

## 2018-06-12 LAB — CBC
HEMATOCRIT: 40 % (ref 36.0–46.0)
Hemoglobin: 12.4 g/dL (ref 12.0–15.0)
MCH: 29.2 pg (ref 26.0–34.0)
MCHC: 31 g/dL (ref 30.0–36.0)
MCV: 94.3 fL (ref 80.0–100.0)
NRBC: 0 % (ref 0.0–0.2)
Platelets: 271 10*3/uL (ref 150–400)
RBC: 4.24 MIL/uL (ref 3.87–5.11)
RDW: 13 % (ref 11.5–15.5)
WBC: 6.6 10*3/uL (ref 4.0–10.5)

## 2018-06-12 LAB — BASIC METABOLIC PANEL
Anion gap: 5 (ref 5–15)
BUN: 12 mg/dL (ref 6–20)
CHLORIDE: 108 mmol/L (ref 98–111)
CO2: 25 mmol/L (ref 22–32)
CREATININE: 0.68 mg/dL (ref 0.44–1.00)
Calcium: 9.2 mg/dL (ref 8.9–10.3)
GFR calc Af Amer: >60 mL/min (ref >60)
GFR calc non Af Amer: >60 mL/min (ref >60)
Glucose, Bld: 93 mg/dL (ref 70–99)
POTASSIUM: 3.7 mmol/L (ref 3.5–5.1)
Sodium: 138 mmol/L (ref 135–145)

## 2018-06-12 LAB — I-STAT BETA HCG BLOOD, ED (MC, WL, AP ONLY): I-stat hCG, quantitative: <5 m[IU]/mL (ref <5)

## 2018-06-12 MED ORDER — MUPIROCIN CALCIUM 2 % EX CREA
TOPICAL_CREAM | Freq: Once | CUTANEOUS | Status: AC
  Administered 2018-06-12: 13:00:00 via TOPICAL
  Filled 2018-06-12: qty 15

## 2018-06-12 MED ORDER — CLINDAMYCIN HCL 150 MG PO CAPS: 300.0000 mg | ORAL_CAPSULE | Freq: Three times a day (TID) | ORAL | 0 refills | Status: AC

## 2018-06-12 MED ORDER — MUPIROCIN 2 % EX OINT: 1.0000 "application " | TOPICAL_OINTMENT | Freq: Two times a day (BID) | CUTANEOUS | 0 refills | Status: AC

## 2018-06-12 MED ORDER — CLINDAMYCIN HCL 150 MG PO CAPS: 300.0000 mg | ORAL_CAPSULE | Freq: Three times a day (TID) | ORAL | 0 refills | Status: DC

## 2018-06-12 NOTE — ED Notes (Signed)
Patient verbalizes understanding of discharge instructions. Opportunity for questioning and answers were provided. Pt discharged from ED. 

## 2018-06-12 NOTE — ED Triage Notes (Signed)
Pt with left face blister that has persisted for 1 1/2 week. She reports feeling bad with dizziness and lightheaded and sometimes lethargic for the last week. Denies fever, n/v/d. The patient is alert and oriented x4 with no acute distress at triage.

## 2018-06-12 NOTE — Discharge Instructions (Addendum)
Apply bactroban ointment to the affected area and use a q-tip to apply it to the inside of both nostrils 2 x daily for the next 7 days.  Take you antibiotic as directed.  Wash your entire body (except face) in Hibiclens (available over the counter) every other day for the next week. Get help right away if: You see spreading redness or swelling of the skin around your sores. You see red streaks coming from your sores. You develop a sore throat.

## 2018-06-12 NOTE — ED Provider Notes (Signed)
[facility] EMERGENCY DEPARTMENT Provider Note   CSN: [id number] Arrival date & time: 06/12/18  1020     History   Chief Complaint Chief Complaint  Patient presents with  . Blister  . Facial Pain  . Dizziness    HPI Brittney Manning is a 42 y.o. female who presents emergency department chief complaint of left facial blister.  She has a past medical history of MRSA.  The patient States that she has had recurrent episodes of the same thing on the left side of her face. She states that seen about a month ago told she might have a spider bite.  She took Bactrim and it improved.  At that time she had central crusting with erythema around it.  She states that about a week ago she developed a small ulceration on the left side of the face.  It is extremely painful and she has had progressive desquamation of the skin with serous drainage.  She been trying Neosporin without improvement.  She has pain radiating into the ear.  HPI  Past Medical History:  Diagnosis Date  . ADHD (attention deficit hyperactivity disorder)   . Anxiety   . Blood in stool   . Chronic cholecystitis with calculus   . Depression   . Headache(784.0)   . Hypertension   . MRSA (methicillin resistant staph aureus) culture positive    at C section site 05-2009 resolved  . Narcotic abuse (HCC)   . Pneumonia    as a child    Patient Active Problem List   Diagnosis Date Noted  . CERUMEN IMPACTION 08/25/2009  . EAR PAIN 08/25/2009  . APHTHOUS ULCERS 08/25/2009  . ANXIETY 09/28/2008  . NARCOTIC ABUSE 08/31/2008  . DEPRESSION 08/31/2008  . ACUTE CYSTITIS 08/31/2008  . ADHD 03/22/2008  . HYPERTENSION 03/22/2008  . VIRAL INFECTION 05/12/2007  . HEADACHE 03/27/2007    Past Surgical History:  Procedure Laterality Date  . CESAREAN SECTION    . CHOLECYSTECTOMY N/A 10/07/2017   Procedure: LAPAROSCOPIC CHOLECYSTECTOMY WITH INTRAOPERATIVE CHOLANGIOGRAM;  Surgeon: Manus Rudd, MD;  Location: [facility]  OR;  Service: General;  Laterality: N/A;  . COLONOSCOPY    . KNEE ARTHROSCOPY  2012   left knee, per Dr. Thurston Hole   . TUBAL LIGATION    . TYMPANOPLASTY  Sept. 2012 and May 2013   right ear twice, per Dr. Ruel Favors at [facility]  . TYMPANOSTOMY TUBE PLACEMENT     rt ear 2009 dr shoemaker     OB History   None      Home Medications    Prior to Admission medications   Medication Sig Start Date End Date Taking? Authorizing Provider  acetaminophen (TYLENOL) 500 MG tablet Take 1,000 mg by mouth every 6 (six) hours as needed for moderate pain or headache.    [provider]  amphetamine-dextroamphetamine (ADDERALL) 30 MG tablet Take 1 tablet by mouth 2 (two) times daily. 06/27/14   Nelwyn Salisbury, MD  Cholecalciferol (VITAMIN D3 PO) Take 1 capsule by mouth daily.    [provider]  Diclofenac Sodium CR 100 MG 24 hr tablet Take 1 tablet (100 mg total) by mouth daily. 03/26/18   Palumbo, April, MD  escitalopram (LEXAPRO) 20 MG tablet TAKE 1 TABLET BY MOUTH EVERY DAY Patient taking differently: TAKE 20 MG BY MOUTH EVERY DAY 07/06/14   Nelwyn Salisbury, MD  ferrous sulfate 325 (65 FE) MG tablet Take 325 mg by mouth daily with breakfast.  [provider]  ibuprofen (ADVIL,MOTRIN) 200 MG tablet Take 1,200 mg by mouth every 6 (six) hours as needed for headache or mild pain.    [provider]  lisinopril (PRINIVIL,ZESTRIL) 20 MG tablet Take 1 tablet (20 mg total) by mouth daily. 08/20/13   Nelwyn Salisbury, MD    Family History Family History  Problem Relation Age of Onset  . Hypertension Unknown   . Diabetes Unknown   . Alcohol abuse Unknown   . Drug abuse Unknown        cocaine addiction   . Cirrhosis Mother   . Hypertension Father   . Diabetes Father     Social History Social History   Tobacco Use  . Smoking status: Never Smoker  . Smokeless tobacco: Never Used  Substance Use Topics  . Alcohol use: Yes    Comment: occ  . Drug use: No      Allergies   Patient has no known allergies.   Review of Systems Review of Systems Ten systems reviewed and are negative for acute change, except as noted in the HPI.    Physical Exam Updated Vital Signs BP 110/61 (BP Location: Left Arm)   Pulse 79   Temp 97.7 F (36.5 C) (Oral)   Resp 16   Ht 5\' 5"  (1.651 m)   Wt 108.9 kg   LMP 05/13/2018 (Exact Date)   SpO2 99%   BMI 39.94 kg/m   Physical Exam  Constitutional: She is oriented to person, place, and time. She appears well-developed and well-nourished. No distress.  HENT:  Head: Normocephalic and atraumatic.  6 cm area of desquamated tissue with serous drainage resembling a burn.  There is surrounding tender erythema.  In the inferior aspect of the wound there is some developing granulation tissue.  No streaking.  There is tender auricular adenopathy present.  Eyes: Conjunctivae are normal. No scleral icterus.  Neck: Normal range of motion.  Cardiovascular: Normal rate, regular rhythm and normal heart sounds. Exam reveals no gallop and no friction rub.  No murmur heard. Pulmonary/Chest: Effort normal and breath sounds normal. No respiratory distress.  Abdominal: Soft. Bowel sounds are normal. She exhibits no distension and no mass. There is no tenderness. There is no guarding.  Neurological: She is alert and oriented to person, place, and time.  Skin: Skin is warm and dry. She is not diaphoretic.  Psychiatric: Her behavior is normal.  Nursing note and vitals reviewed.    ED Treatments / Results  Labs (all labs ordered are listed, but only abnormal results are displayed) Labs Reviewed  URINALYSIS, ROUTINE W REFLEX MICROSCOPIC - Abnormal; Notable for the following components:      Result Value   APPearance HAZY (*)    All other components within normal limits  BASIC METABOLIC PANEL  CBC  I-STAT BETA HCG BLOOD, ED ([facility], [facility], [facility] ONLY)    EKG None  Radiology No results found.  Procedures Procedures  (including critical care time)  Medications Ordered in ED Medications  mupirocin cream (BACTROBAN) 2 % (has no administration in time range)     Initial Impression / Assessment and Plan / ED Course  I have reviewed the triage vital signs and the nursing notes.  Pertinent labs & imaging results that were available during my care of the patient were reviewed by me and considered in my medical decision making (see chart for details).    Patient with history of MRSA.  The patient appears to have bullous impetigo.  I believe that she has been reinfecting herself with her make-up brushes that she has not cleaned any of them.  Patient will be given Bactroban ointment, clindamycin.  She is instructed to cleanse herself in Hibiclens and throw away all of her brushes.  She does have a history of MRSA and I have also instructed the patient to place the Bactroban ointment in her nasal passages.  We had a long discussion about scope of practice in the emergency department and our ability to perform definitive diagnosis on skin lesions is limited.  The patient understands that if she is continuing to have problems she will definitely need to follow-up with a dermatologist.  Patient appears appropriate for discharge at this time.  No evidence of erysipelas.  Final Clinical Impressions(s) / ED Diagnoses   Final diagnoses:  None    ED Discharge Orders    None       Arthor Captain, PA-C 06/12/18 1336    Tilden Fossa, MD 06/14/18 [phone]

## 2018-06-12 NOTE — ED Notes (Signed)
Pt ambulated from waiting room to B19 with steady gait. Tolerated well. Pt in restroom to provide UA

## 2019-10-21 IMAGING — RF DG CHOLANGIOGRAM OPERATIVE
1 series · 4 of 4 positions shown · non-contrast
Comparison: CT 08/22/2012

CLINICAL DATA: 41-year-old female with a history cholelithiasis

EXAM:
INTRAOPERATIVE CHOLANGIOGRAM
TECHNIQUE: Cholangiographic images from the C-arm fluoroscopic device were
submitted for interpretation post-operatively. Please see the
procedural report for the amount of contrast and the fluoroscopy
time utilized.

[Series 1: run · 4 of 34 frames shown]
[frame 2/34]
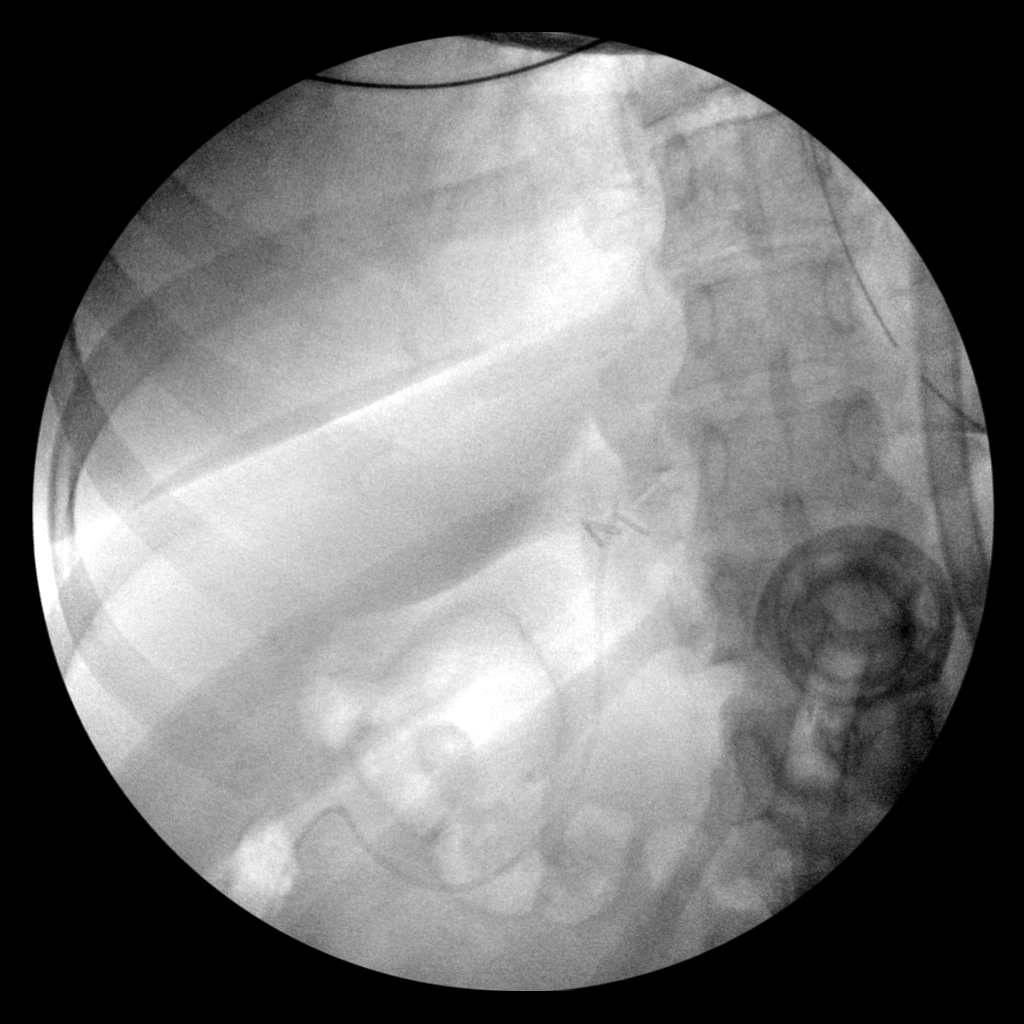
[frame 6/34]
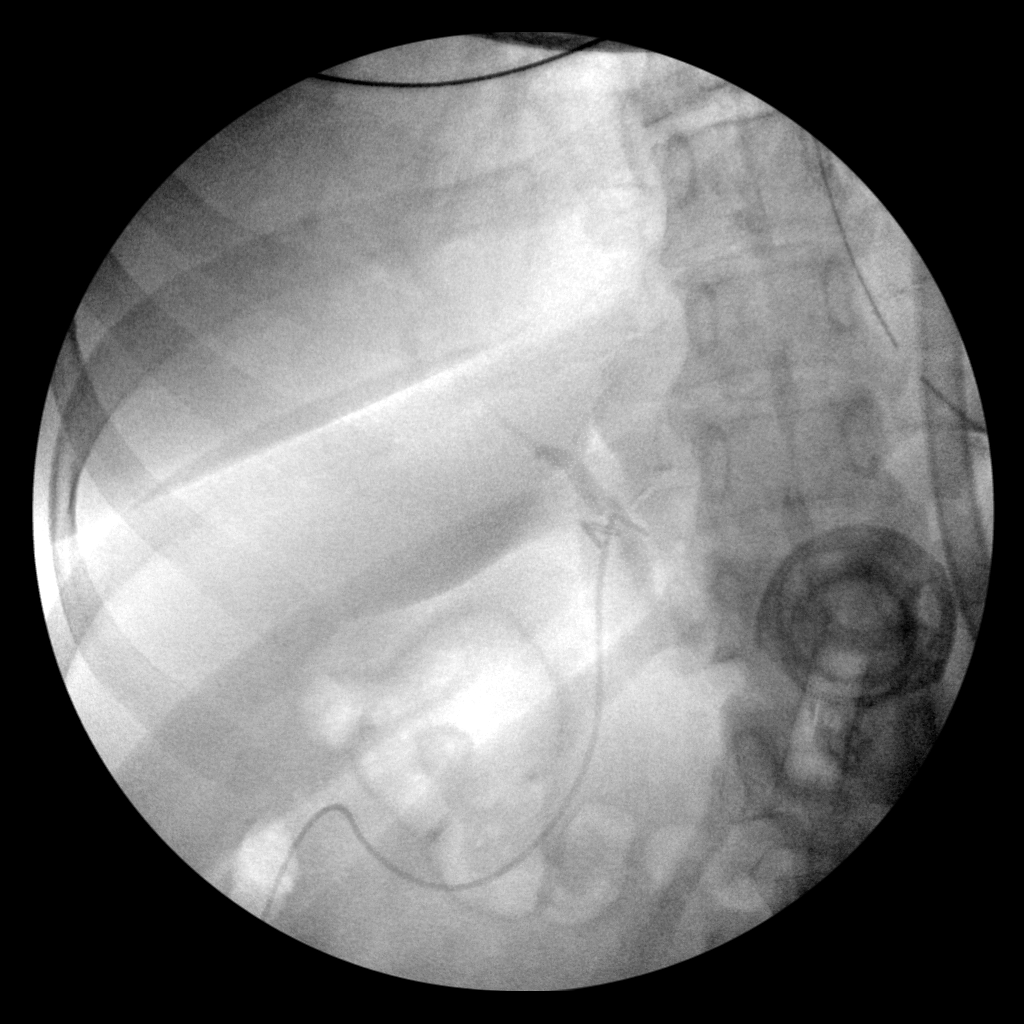
[frame 18/34]
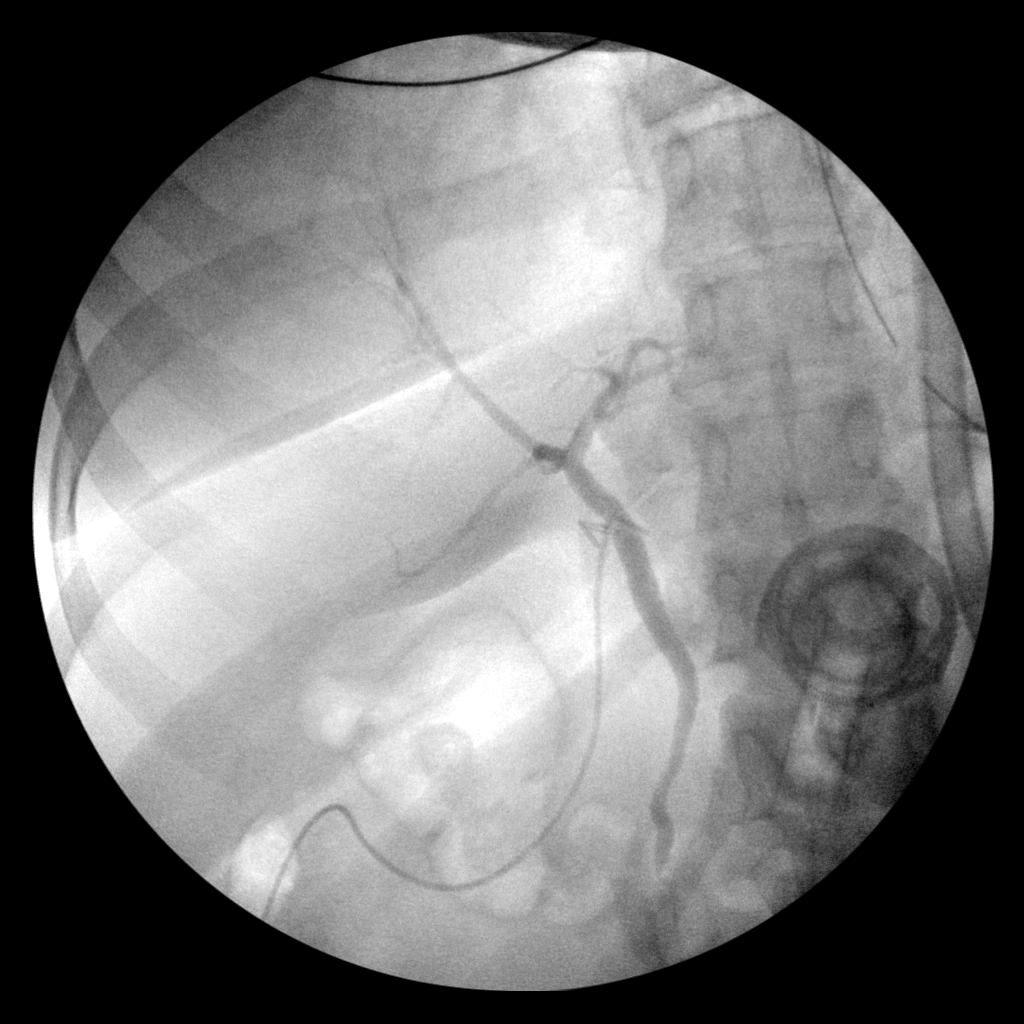
[frame 29/34]
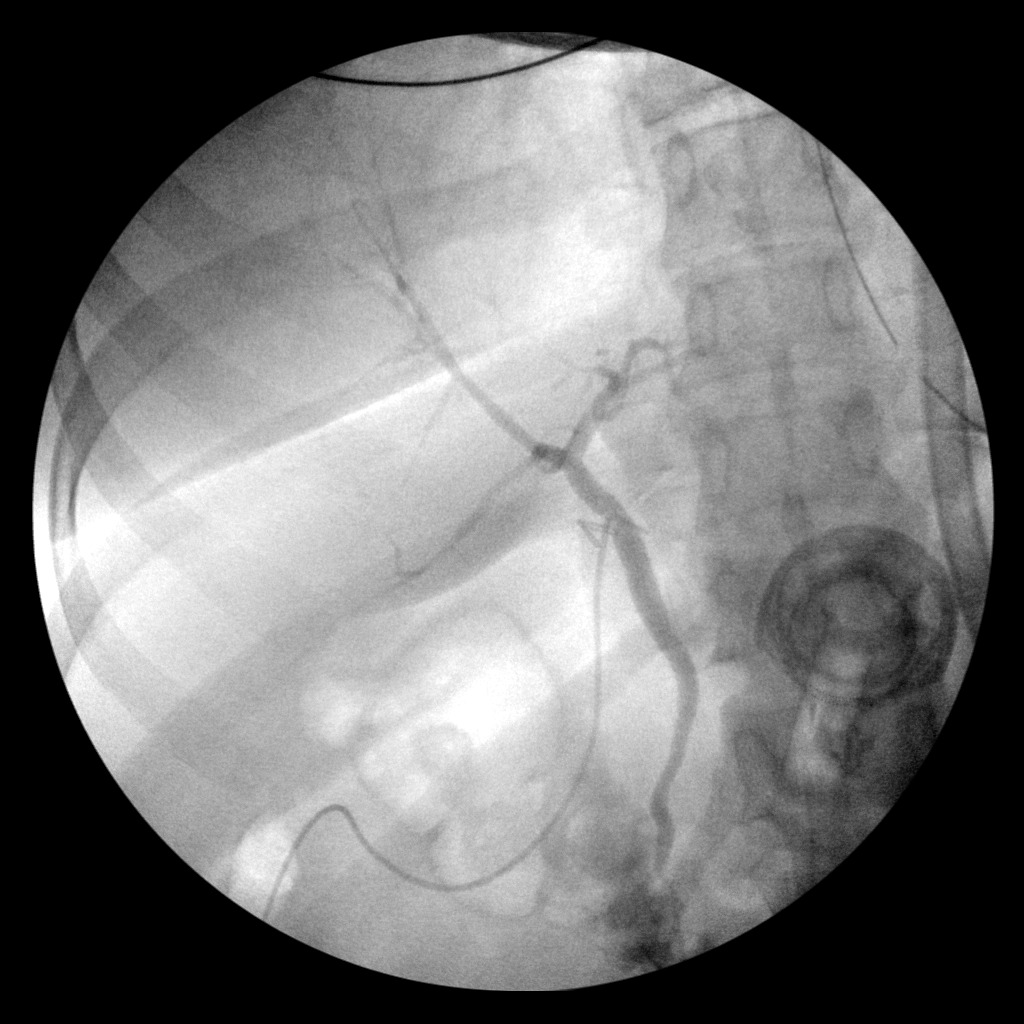

[4 of 4 positions shown; findings below may reference images not displayed]

FINDINGS: Surgical instruments project over the upper abdomen.

There is cannulation of the cystic duct/gallbladder neck, with
antegrade infusion of contrast. Caliber of the extrahepatic ductal
system within normal limits.

No definite filling defect within the extrahepatic ducts identified.

Free flow of contrast across the ampulla.
IMPRESSION: Intraoperative cholangiogram demonstrates extrahepatic biliary ducts
of unremarkable caliber, with no definite filling defects
identified. Free flow of contrast across the ampulla.

Please refer to the dictated operative report for full details of
intraoperative findings and procedure

## 2023-03-11 ENCOUNTER — Encounter (HOSPITAL_BASED_OUTPATIENT_CLINIC_OR_DEPARTMENT_OTHER): Payer: Self-pay

## 2023-03-11 DIAGNOSIS — G4739 Other sleep apnea: Secondary | ICD-10-CM

## 2023-05-30 ENCOUNTER — Ambulatory Visit (HOSPITAL_BASED_OUTPATIENT_CLINIC_OR_DEPARTMENT_OTHER): Payer: Medicaid Other | Attending: Internal Medicine | Admitting: Internal Medicine

## 2023-05-30 VITALS — Ht 66.0 in | Wt 217.0 lb

## 2023-05-30 DIAGNOSIS — G4739 Other sleep apnea: Secondary | ICD-10-CM

## 2023-05-30 DIAGNOSIS — R0683 Snoring: Secondary | ICD-10-CM | POA: Insufficient documentation

## 2023-05-30 DIAGNOSIS — G479 Sleep disorder, unspecified: Secondary | ICD-10-CM | POA: Insufficient documentation

## 2023-05-30 DIAGNOSIS — G4733 Obstructive sleep apnea (adult) (pediatric): Secondary | ICD-10-CM | POA: Diagnosis not present

## 2023-06-07 DIAGNOSIS — G4739 Other sleep apnea: Secondary | ICD-10-CM | POA: Diagnosis not present

## 2023-06-07 NOTE — Procedures (Signed)
     Patient Name: Brittney Manning, Brittney Manning Date: 05/30/2023 Gender: Female D.O.B: October 27, 1975 Age (years): 47 Referring Provider: Rometta Emery Height (inches): 66 Interpreting Physician: Jetty Duhamel MD, ABSM Weight (lbs): 217 RPSGT: Armen Pickup BMI: 35 MRN: [id number] Neck Size: 14.50  CLINICAL INFORMATION Sleep Study Type: NPSG Indication for sleep study: Obesity, Snoring Epworth Sleepiness Score: 4  SLEEP STUDY TECHNIQUE As per the AASM Manual for the Scoring of Sleep and Associated Events v2.3 (April 2016) with a hypopnea requiring 4% desaturations.  The channels recorded and monitored were frontal, central and occipital EEG, electrooculogram (EOG), submentalis EMG (chin), nasal and oral airflow, thoracic and abdominal wall motion, anterior tibialis EMG, snore microphone, electrocardiogram, and pulse oximetry.  MEDICATIONS Medications self-administered by patient taken the night of the study : TRAZODONE  SLEEP ARCHITECTURE The study was initiated at 10:22:52 PM and ended at 4:52:17 AM.  Sleep onset time was 38.1 minutes and the sleep efficiency was 81.2%. The total sleep time was 316.3 minutes.  Stage REM latency was N/A minutes.  The patient spent 4.0% of the night in stage N1 sleep, 96.0% in stage N2 sleep, 0.0% in stage N3 and 0% in REM.  Alpha intrusion was absent.  Supine sleep was 41.51%.  RESPIRATORY PARAMETERS The overall apnea/hypopnea index (AHI) was 0.4 per hour. There were 2 total apneas, including 2 obstructive, 0 central and 0 mixed apneas. There were 0 hypopneas and 4 RERAs.  The AHI during Stage REM sleep was N/A per hour.  AHI while supine was 0.9 per hour.  The mean oxygen saturation was 93.6%. The minimum SpO2 during sleep was 91.0%.  moderate snoring was noted during this study.  CARDIAC DATA The 2 lead EKG demonstrated sinus rhythm. The mean heart rate was 63.8 beats per minute. Other EKG findings include: None.  LEG MOVEMENT  DATA The total PLMS were 0 with a resulting PLMS index of 0.0. Associated arousal with leg movement index was 0.0 .  IMPRESSIONS - Occasional obstructive sleep apnea, within normal limits (AHI = 0.4/h). - The patient had minimal or no oxygen desaturation during the study (Min O2 = 91.0%) - The patient snored with moderate snoring volume. - No cardiac abnormalities were noted during this study. - Clinically significant periodic limb movements did not occur during sleep. No significant associated arousals. - Sleep architecture shows medication effect with 96% of sleep time in stage N2, no REM.  DIAGNOSIS - Other sleep disorder - Primary Snoring  RECOMMENDATIONS - Consider if medication hang-over is contributing to complaint of daytime tiredness. - Sleep hygiene should be reviewed to assess factors that may improve sleep quality. - Weight management and regular exercise should be initiated or continued if appropriate.  [Electronically signed] 06/07/2023 12:58 PM  Jetty Duhamel MD, ABSM Diplomate, American Board of Sleep Medicine NPI: [id number]                        Jetty Duhamel Diplomate, American Board of Sleep Medicine  ELECTRONICALLY SIGNED ON:  06/07/2023, 12:53 PM [facility] PH: [phone]   FX: [phone] ACCREDITED BY THE AMERICAN ACADEMY OF SLEEP MEDICINE
